# Patient Record
Sex: Male | Born: 1970 | Race: White | Hispanic: No | Marital: Married | Smoking: Never smoker
Health system: Southern US, Community
[De-identification: ages and names within clinical notes are randomized; demographics above are authoritative.]

## PROBLEM LIST (undated history)

## (undated) DIAGNOSIS — Z8719 Personal history of other diseases of the digestive system: Secondary | ICD-10-CM

## (undated) DIAGNOSIS — K649 Unspecified hemorrhoids: Secondary | ICD-10-CM

## (undated) DIAGNOSIS — C801 Malignant (primary) neoplasm, unspecified: Secondary | ICD-10-CM

## (undated) HISTORY — PX: CYST REMOVAL HAND: SHX6279

## (undated) HISTORY — PX: WISDOM TOOTH EXTRACTION: SHX21

## (undated) HISTORY — DX: Malignant (primary) neoplasm, unspecified: C80.1

## (undated) HISTORY — DX: Personal history of other diseases of the digestive system: Z87.19

## (undated) HISTORY — DX: Unspecified hemorrhoids: K64.9

---

## 1998-11-20 ENCOUNTER — Ambulatory Visit (HOSPITAL_BASED_OUTPATIENT_CLINIC_OR_DEPARTMENT_OTHER): Admission: RE | Admit: 1998-11-20 | Discharge: 1998-11-20 | Payer: Self-pay | Admitting: Orthopedic Surgery

## 2001-05-02 DIAGNOSIS — C801 Malignant (primary) neoplasm, unspecified: Secondary | ICD-10-CM

## 2001-05-02 HISTORY — DX: Malignant (primary) neoplasm, unspecified: C80.1

## 2003-04-07 ENCOUNTER — Encounter: Admission: RE | Admit: 2003-04-07 | Discharge: 2003-04-07 | Payer: Self-pay | Admitting: Dermatology

## 2005-01-12 ENCOUNTER — Ambulatory Visit (HOSPITAL_COMMUNITY): Admission: RE | Admit: 2005-01-12 | Discharge: 2005-01-12 | Payer: Self-pay | Admitting: Dermatology

## 2005-10-20 ENCOUNTER — Ambulatory Visit (HOSPITAL_COMMUNITY): Admission: RE | Admit: 2005-10-20 | Discharge: 2005-10-20 | Payer: Self-pay | Admitting: Pulmonary Disease

## 2006-02-03 ENCOUNTER — Ambulatory Visit (HOSPITAL_COMMUNITY): Admission: RE | Admit: 2006-02-03 | Discharge: 2006-02-03 | Payer: Self-pay | Admitting: Dermatology

## 2010-05-22 ENCOUNTER — Encounter: Payer: Self-pay | Admitting: Dermatology

## 2011-11-25 ENCOUNTER — Other Ambulatory Visit: Payer: Self-pay | Admitting: *Deleted

## 2011-11-25 DIAGNOSIS — N5089 Other specified disorders of the male genital organs: Secondary | ICD-10-CM

## 2011-11-28 ENCOUNTER — Ambulatory Visit
Admission: RE | Admit: 2011-11-28 | Discharge: 2011-11-28 | Disposition: A | Discharge status: Home / Self Care | Payer: BC Managed Care – PPO | Source: Ambulatory Visit | Attending: *Deleted | Admitting: *Deleted

## 2011-11-28 DIAGNOSIS — N5089 Other specified disorders of the male genital organs: Secondary | ICD-10-CM

## 2014-05-12 ENCOUNTER — Ambulatory Visit: Payer: BC Managed Care – PPO | Admitting: Internal Medicine

## 2016-02-02 ENCOUNTER — Ambulatory Visit (INDEPENDENT_AMBULATORY_CARE_PROVIDER_SITE_OTHER): Payer: BC Managed Care – PPO | Admitting: Urology

## 2016-02-02 DIAGNOSIS — E291 Testicular hypofunction: Secondary | ICD-10-CM

## 2016-12-14 ENCOUNTER — Encounter: Payer: Self-pay | Admitting: Gastroenterology

## 2017-01-19 ENCOUNTER — Encounter: Payer: Self-pay | Admitting: Gastroenterology

## 2017-01-19 ENCOUNTER — Encounter (INDEPENDENT_AMBULATORY_CARE_PROVIDER_SITE_OTHER): Payer: Self-pay

## 2017-01-19 ENCOUNTER — Ambulatory Visit (INDEPENDENT_AMBULATORY_CARE_PROVIDER_SITE_OTHER): Payer: BC Managed Care – PPO | Admitting: Gastroenterology

## 2017-01-19 VITALS — BP 110/84 | HR 58 | Ht 70.0 in | Wt 177.0 lb

## 2017-01-19 DIAGNOSIS — K625 Hemorrhage of anus and rectum: Secondary | ICD-10-CM | POA: Diagnosis not present

## 2017-01-19 MED ORDER — NA SULFATE-K SULFATE-MG SULF 17.5-3.13-1.6 GM/177ML PO SOLN: 1.0000 | Freq: Once | ORAL | 0 refills | Status: AC

## 2017-01-19 NOTE — Patient Instructions (Signed)
If you are age 47 or older, your body mass index should be between 23-30. Your Body mass index is 25.4 kg/m. If this is out of the aforementioned range listed, please consider follow up with your Primary Care Provider.  If you are age 76 or younger, your body mass index should be between 19-25. Your Body mass index is 25.4 kg/m. If this is out of the aformentioned range listed, please consider follow up with your Primary Care Provider.   You have been scheduled for a colonoscopy. Please follow written instructions given to you at your visit today.  Please pick up your prep supplies at the pharmacy within the next 1-3 days. If you use inhalers (even only as needed), please bring them with you on the day of your procedure. Your physician has requested that you go to www.startemmi.com and enter the access code given to you at your visit today. This web site gives a general overview about your procedure. However, you should still follow specific instructions given to you by our office regarding your preparation for the procedure.  Thank you for choosing [facility]  Dr Wilfrid Lund III

## 2017-01-19 NOTE — Progress Notes (Signed)
[facility] Consult Note:  History: Terrence Bruce 01/19/2017  Referring physician: Celene Squibb, MD  Reason for consult/chief complaint: Hemorrhoids (intermittent rectal bleeding )   Subjective  HPI:  This is a 46 year old [employer] self-referred after recommendation from a coworker for evaluation of his rectal bleeding. He reports a couple of years of intermittent rectal bleeding with occasional itching and burning. He denies dyschezia or chronic abdominal pain. His appetite is good and his weight stable. He denies any upper digestive symptoms. He has had no improvement from suppository therapy. His bowel habits are regular, he does not strain, but admits he sometimes sits on the toilet too long while reading.   ROS:  Review of Systems  Constitutional: Negative for appetite change and unexpected weight change.  HENT: Negative for mouth sores and voice change.   Eyes: Negative for pain and redness.  Respiratory: Negative for cough and shortness of breath.   Cardiovascular: Negative for chest pain and palpitations.  Genitourinary: Negative for dysuria and hematuria.  Musculoskeletal: Negative for arthralgias and myalgias.  Skin: Negative for pallor and rash.  Neurological: Negative for weakness and headaches.  Hematological: Negative for adenopathy.     Past Medical History: Past Medical History:  Diagnosis Date  . Hemorrhoids      Past Surgical History: Past Surgical History:  Procedure Laterality Date  . CYST REMOVAL HAND     finger  . WISDOM TOOTH EXTRACTION       Family History: Family History  Problem Relation Age of Onset  . Heart disease Mother        bypass  . Liver disease Father   . Prostate cancer Maternal Grandfather        onet in his 49s    Social History: Social History   Social History  . Marital status: Married    Spouse name: N/A  . Number of children: N/A  . 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 Smoking status: Never Smoker  . Smokeless tobacco: Never Used  . Alcohol use Yes     Comment: occ  . Drug use: No  . Sexual activity: Yes   Other Topics Concern  . None   Social History Narrative  . None    Allergies: No Known Allergies  Outpatient Meds: Current Outpatient Prescriptions  Medication Sig Dispense Refill  . Na Sulfate-K Sulfate-Mg Sulf 17.5-3.13-1.6 GM/180ML SOLN Take 1 kit by mouth once. 354 mL 0   No current facility-administered medications for this visit.       ___________________________________________________________________ Objective   Exam:  BP 110/84   Pulse (!) 58   Ht '5\' 10"'  (1.778 m)   Wt 177 lb (80.3 kg)   BMI 25.40 kg/m    General: this is a(n) Well-appearing middle-aged man   Eyes: sclera anicteric, no redness  ENT: oral mucosa moist without lesions, no cervical or supraclavicular lymphadenopathy, good dentition  CV: RRR without murmur, S1/S2, no JVD, no peripheral edema  Resp: clear to auscultation bilaterally, normal RR and effort noted  GI: soft, no tenderness, with active bowel sounds. No guarding or palpable organomegaly noted.  Skin; warm and dry, no rash or jaundice noted  Neuro: awake, alert and oriented x 3. Normal gross motor function and fluent speech Rectal: Normal external, normal sphincter tone, no fissure or tenderness. No palpable internal lesions. Soft stool in the rectal vault. Prostate soft,  no nodules felt   Assessment: Encounter Diagnosis  Name Primary?  . Rectal bleeding Yes    Probable symptomatic internal hemorrhoids. Less likely neoplasia as cause for bleeding.  Plan:  Colonoscopy to identify source of bleeding. He is agreeable after discussion of procedure and risks.  The benefits and risks of the planned procedure were described in detail with the patient or (when appropriate) their health care proxy.  Risks were outlined as  including, but not limited to, bleeding, infection, perforation, adverse medication reaction leading to cardiac or pulmonary decompensation, or pancreatitis (if ERCP).  The limitation of incomplete mucosal visualization was also discussed.  No guarantees or warranties were given.  Appointment was made for probable banding shortly after the colonoscopy. Hemorrhoidal anatomy was described to the patient using a diagram, as well as an initial discussion of rationale for and technique of banding. It typically requires 3 sessions separated by 2 or 3 weeks to band the usual 3 columns of hemorrhoids.  Thank you for the courtesy of this consult.  Please call me with any questions or concerns.  Nelida Meuse III  CC: Celene Squibb, MD

## 2017-02-22 ENCOUNTER — Encounter: Payer: Self-pay | Admitting: Gastroenterology

## 2017-03-08 ENCOUNTER — Encounter: Payer: BC Managed Care – PPO | Admitting: Gastroenterology

## 2017-03-08 ENCOUNTER — Telehealth: Payer: Self-pay | Admitting: Gastroenterology

## 2017-03-08 NOTE — Telephone Encounter (Signed)
Yes, please call patient back to reschedule his hemorrhoid banding to after colonoscopy. Thank you.

## 2017-03-14 ENCOUNTER — Encounter: Payer: BC Managed Care – PPO | Admitting: Gastroenterology

## 2017-04-05 ENCOUNTER — Encounter: Payer: BC Managed Care – PPO | Admitting: Gastroenterology

## 2017-04-14 ENCOUNTER — Encounter: Payer: BC Managed Care – PPO | Admitting: Gastroenterology

## 2017-05-23 ENCOUNTER — Encounter: Payer: Self-pay | Admitting: Gastroenterology

## 2017-06-23 ENCOUNTER — Ambulatory Visit (AMBULATORY_SURGERY_CENTER): Payer: Self-pay

## 2017-06-23 ENCOUNTER — Encounter (INDEPENDENT_AMBULATORY_CARE_PROVIDER_SITE_OTHER): Payer: Self-pay

## 2017-06-23 ENCOUNTER — Telehealth: Payer: Self-pay

## 2017-06-23 VITALS — Ht 70.0 in | Wt 187.4 lb

## 2017-06-23 DIAGNOSIS — K625 Hemorrhage of anus and rectum: Secondary | ICD-10-CM

## 2017-06-23 DIAGNOSIS — Z800 Family history of malignant neoplasm of digestive organs: Secondary | ICD-10-CM

## 2017-06-23 MED ORDER — NA SULFATE-K SULFATE-MG SULF 17.5-3.13-1.6 GM/177ML PO SOLN: 1.0000 | Freq: Once | ORAL | 0 refills | Status: AC

## 2017-06-23 NOTE — Telephone Encounter (Signed)
SENT REFERRAL TO SCHEDULING, NOTES ON FILE FROM DR London Pepper [phone]

## 2017-06-23 NOTE — Progress Notes (Signed)
Per pt, no allergies to soy or egg products.Pt not taking any weight loss meds or using  O2 at home.  Pt refused emmi video. 

## 2017-06-26 ENCOUNTER — Encounter: Payer: Self-pay | Admitting: Gastroenterology

## 2017-07-06 ENCOUNTER — Encounter: Payer: Self-pay | Admitting: Gastroenterology

## 2017-07-06 ENCOUNTER — Ambulatory Visit (AMBULATORY_SURGERY_CENTER): Payer: BC Managed Care – PPO | Admitting: Gastroenterology

## 2017-07-06 VITALS — BP 114/69 | HR 51 | Temp 98.0°F | Resp 11 | Ht 70.0 in | Wt 187.0 lb

## 2017-07-06 DIAGNOSIS — K921 Melena: Secondary | ICD-10-CM | POA: Diagnosis present

## 2017-07-06 DIAGNOSIS — D125 Benign neoplasm of sigmoid colon: Secondary | ICD-10-CM

## 2017-07-06 DIAGNOSIS — D127 Benign neoplasm of rectosigmoid junction: Secondary | ICD-10-CM

## 2017-07-06 DIAGNOSIS — K625 Hemorrhage of anus and rectum: Secondary | ICD-10-CM

## 2017-07-06 MED ORDER — SODIUM CHLORIDE 0.9 % IV SOLN: 500.0000 mL | Freq: Once | INTRAVENOUS | Status: AC

## 2017-07-06 NOTE — Progress Notes (Signed)
Called to room to assist during endoscopic procedure.  Patient ID and intended procedure confirmed with present staff. Received instructions for my participation in the procedure from the performing physician.  

## 2017-07-06 NOTE — Progress Notes (Signed)
Pt's states no medical or surgical changes since previsit or office visit. 

## 2017-07-06 NOTE — Op Note (Signed)
[facility] Patient Name: Alexandria Current Procedure Date: 07/06/2017 9:20 AM MRN: [id number] Endoscopist: Marmet. Loletha Carrow , MD Age: 47 Referring MD:  Date of Birth: 25-Aug-1970 Gender: Male Account #: [id number] Procedure:                Colonoscopy Indications:              Rectal bleeding Medicines:                Monitored Anesthesia Care Procedure:                Pre-Anesthesia Assessment:                           - Prior to the procedure, a History and Physical                            was performed, and patient medications and                            allergies were reviewed. The patient's tolerance of                            previous anesthesia was also reviewed. The risks                            and benefits of the procedure and the sedation                            options and risks were discussed with the patient.                            All questions were answered, and informed consent                            was obtained. Prior Anticoagulants: The patient has                            taken no previous anticoagulant or antiplatelet                            agents. ASA Grade Assessment: II - A patient with                            mild systemic disease. After reviewing the risks                            and benefits, the patient was deemed in                            satisfactory condition to undergo the procedure.                           After obtaining informed consent, the colonoscope  was passed under direct vision. Throughout the                            procedure, the patient's blood pressure, pulse, and                            oxygen saturations were monitored continuously. The                            Colonoscope was introduced through the anus and                            advanced to the the cecum, identified by                            appendiceal orifice and ileocecal valve. The                    colonoscopy was performed without difficulty. The                            patient tolerated the procedure well. The quality                            of the bowel preparation was excellent. The                            ileocecal valve, appendiceal orifice, and rectum                            were photographed. Scope In: 9:30:47 AM Scope Out: 9:44:29 AM Scope Withdrawal Time: 0 hours 11 minutes 3 seconds  Total Procedure Duration: 0 hours 13 minutes 42 seconds  Findings:                 The perianal and digital rectal examinations were                            normal.                           A 5 mm polyp was found in the recto-sigmoid colon.                            The polyp was sessile. The polyp was removed with a                            cold snare. Resection and retrieval were complete.                           The exam was otherwise without abnormality on                            direct and retroflexion views. Complications:            No immediate complications. Estimated Blood Loss:  Estimated blood loss was minimal. Impression:               - One 5 mm polyp at the recto-sigmoid colon,                            removed with a cold snare. Resected and retrieved.                           - The examination was otherwise normal on direct                            and retroflexion views.                           Benign anal bleeding - no hemorrhoids seen. Recommendation:           - Patient has a contact number available for                            emergencies. The signs and symptoms of potential                            delayed complications were discussed with the                            patient. Return to normal activities tomorrow.                            Written discharge instructions were provided to the                            patient.                           - Resume previous diet.                           - Continue  present medications.                           - Await pathology results.                           - Repeat colonoscopy is recommended for                            surveillance. The colonoscopy date will be                            determined after pathology results from today's                            exam become available for review.                           - Stool softener as needed.  Avoid prolonged toilet time.                           Return to my office as needed. Mavryk Pino L. Loletha Carrow, MD 07/06/2017 9:57:35 AM This report has been signed electronically.

## 2017-07-06 NOTE — Patient Instructions (Signed)
Handout given on polyps  YOU HAD AN ENDOSCOPIC PROCEDURE TODAY AT THE [facility]:   Refer to the procedure report that was given to you for any specific questions about what was found during the examination.  If the procedure report does not answer your questions, please call your gastroenterologist to clarify.  If you requested that your care partner not be given the details of your procedure findings, then the procedure report has been included in a sealed envelope for you to review at your convenience later.  YOU SHOULD EXPECT: Some feelings of bloating in the abdomen. Passage of more gas than usual.  Walking can help get rid of the air that was put into your GI tract during the procedure and reduce the bloating. If you had a lower endoscopy (such as a colonoscopy or flexible sigmoidoscopy) you may notice spotting of blood in your stool or on the toilet paper. If you underwent a bowel prep for your procedure, you may not have a normal bowel movement for a few days.  Please Note:  You might notice some irritation and congestion in your nose or some drainage.  This is from the oxygen used during your procedure.  There is no need for concern and it should clear up in a day or so.  SYMPTOMS TO REPORT IMMEDIATELY:   Following lower endoscopy (colonoscopy or flexible sigmoidoscopy):  Excessive amounts of blood in the stool  Significant tenderness or worsening of abdominal pains  Swelling of the abdomen that is new, acute  Fever of 100F or higher    For urgent or emergent issues, a gastroenterologist can be reached at any hour by calling [phone].   DIET:  We do recommend a small meal at first, but then you may proceed to your regular diet.  Drink plenty of fluids but you should avoid alcoholic beverages for 24 hours.  ACTIVITY:  You should plan to take it easy for the rest of today and you should NOT DRIVE or use heavy machinery until tomorrow (because of the sedation  medicines used during the test).    FOLLOW UP: Our staff will call the number listed on your records the next business day following your procedure to check on you and address any questions or concerns that you may have regarding the information given to you following your procedure. If we do not reach you, we will leave a message.  However, if you are feeling well and you are not experiencing any problems, there is no need to return our call.  We will assume that you have returned to your regular daily activities without incident.  If any biopsies were taken you will be contacted by phone or by letter within the next 1-3 weeks.  Please call us at [phone] if you have not heard about the biopsies in 3 weeks.    SIGNATURES/CONFIDENTIALITY: You and/or your care partner have signed paperwork which will be entered into your electronic medical record.  These signatures attest to the fact that that the information above on your After Visit Summary has been reviewed and is understood.  Full responsibility of the confidentiality of this discharge information lies with you and/or your care-partner. 

## 2017-07-06 NOTE — Progress Notes (Signed)
Report to PACU, RN, vss, BBS= Clear.  

## 2017-07-07 ENCOUNTER — Telehealth: Payer: Self-pay | Admitting: *Deleted

## 2017-07-07 NOTE — Telephone Encounter (Signed)
  Follow up Call-  Call back number 07/06/2017  Post procedure Call Back phone  # [phone]  Permission to leave phone message Yes  Some recent data might be hidden     Patient questions:  Do you have a fever, pain , or abdominal swelling? No. Pain Score  0 *  Have you tolerated food without any problems? Yes.    Have you been able to return to your normal activities? Yes.    Do you have any questions about your discharge instructions: Diet   No. Medications  No. Follow up visit  No.  Do you have questions or concerns about your Care? No.  Actions: * If pain score is 4 or above: No action needed, pain <4.

## 2017-07-11 ENCOUNTER — Encounter: Payer: Self-pay | Admitting: Gastroenterology

## 2017-07-12 ENCOUNTER — Encounter: Payer: Self-pay | Admitting: *Deleted

## 2017-07-13 ENCOUNTER — Other Ambulatory Visit: Payer: Self-pay

## 2017-07-13 ENCOUNTER — Encounter: Payer: Self-pay | Admitting: Cardiovascular Disease

## 2017-07-13 ENCOUNTER — Ambulatory Visit: Payer: BC Managed Care – PPO | Admitting: Cardiovascular Disease

## 2017-07-13 VITALS — BP 118/74 | HR 51 | Ht 70.0 in | Wt 182.0 lb

## 2017-07-13 DIAGNOSIS — R079 Chest pain, unspecified: Secondary | ICD-10-CM | POA: Diagnosis not present

## 2017-07-13 DIAGNOSIS — E785 Hyperlipidemia, unspecified: Secondary | ICD-10-CM | POA: Diagnosis not present

## 2017-07-13 NOTE — Patient Instructions (Signed)
Medication Instructions:  Continue all current medications.  Labwork: none  Testing/Procedures: none  Follow-Up: As needed.    Any Other Special Instructions Will Be Listed Below (If Applicable).  If you need a refill on your cardiac medications before your next appointment, please call your pharmacy.  

## 2017-07-13 NOTE — Progress Notes (Signed)
CARDIOLOGY CONSULT NOTE  Patient ID: Terrence Bruce MRN: [id number] DOB/AGE: 01/23/1971 47 y.o.  Admit date: (Not on file) Primary Physician: London Pepper, MD Referring Physician: Dr. Orland Mustard  Reason for Consultation: Chest pain  HPI: Terrence Bruce is a 47 y.o. male who is being seen today for the evaluation of chest pain at the request of London Pepper, MD.   ECG performed today which I reviewed demonstrates sinus bradycardia, 52 bpm, with no ischemic ST segment or T wave abnormalities.  I reviewed notes from his PCP including labs.  Recent lipids demonstrate total cholesterol 231, triglycerides 182, HDL 45, LDL 149.  He is working on lifestyle modification to control lipids.  HbA1c was 5.4%.  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  He said he had been under more stress recently as he was trying to finish his degree which he did in December 2018.  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  This also led to more stress and he began experiencing episodic nonexertional retrosternal chest discomfort.    His wife is a PA.  She recommended he try Zantac.  Since that time symptoms have significantly resolved.  He now exercises regularly and runs on a treadmill without any exertional chest pain or dyspnea.  4 weeks ago he underwent a state trooper evaluation where he had to wear a vest and jump 10 feet across a ditch, crawl under a fence, run, 230 step ups, and move a 200 pound barrel.  The time limit was 6 minutes and he completed the exercise and 3 minutes and 17 seconds.   Family history: Mother underwent four-vessel CABG at the age of 52.  She has hypertension and hypercholesterolemia.  She is a non-smoker.  She grew up eating fried foods.   No Known Allergies  Current Outpatient Medications  Medication Sig Dispense Refill  . Omega-3 Fatty Acids (FISH OIL PO) Take by mouth.    .  ranitidine (ZANTAC) 150 MG tablet Take 150 mg by mouth 2 (two) times daily.     Current Facility-Administered Medications  Medication Dose Route Frequency Provider Last Rate Last Dose  . 0.9 %  sodium chloride infusion  500 mL Intravenous Once Doran Stabler, MD        Past Medical History:  Diagnosis Date  . Cancer ([place]) 2003   melanoma on back  . Hemorrhoids   . History of rectal bleeding     Past Surgical History:  Procedure Laterality Date  . CYST REMOVAL HAND     finger/right trigger   . WISDOM TOOTH EXTRACTION      Social History   Socioeconomic History  . Marital status: Married    Spouse name: Not on file  . Number of children: Not on file  . Years of education: Not on file  . Highest education level: Not on file  Social Needs  . Financial resource strain: Not on file  . Food insecurity - worry: Not on file  . Food insecurity - inability: Not on file  . Transportation needs - medical: Not on file  . Transportation needs - non-medical: Not on file  Occupational History  . 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 Smoking status: Never Smoker  . Smokeless tobacco: Never Used  Substance and Sexual Activity  . Alcohol use: Yes    Comment: occ  .  Drug use: No  . Sexual activity: Yes  Other Topics Concern  . Not on file  Social History Narrative  . Not on file      Current Meds  Medication Sig  . Omega-3 Fatty Acids (FISH OIL PO) Take by mouth.  . ranitidine (ZANTAC) 150 MG tablet Take 150 mg by mouth 2 (two) times daily.   Current Facility-Administered Medications for the 07/13/17 encounter (Office Visit) with Herminio Commons, MD  Medication  . 0.9 %  sodium chloride infusion      Review of systems complete and found to be negative unless listed above in HPI    Physical exam Blood pressure 118/74, pulse (!) 51, height 5\' 10"  (1.778 m), weight 182 lb (82.6 kg), SpO2 99 %. General: NAD Neck: No JVD, no thyromegaly or thyroid nodule.   Lungs: Clear to auscultation bilaterally with normal respiratory effort. CV: Nondisplaced PMI.  Bradycardic, regular rhythm, normal S1/S2, no S3/S4, no murmur.  No peripheral edema.  No carotid bruit.    Abdomen: Soft, nontender, no distention.  Skin: Intact without lesions or rashes.  Neurologic: Alert and oriented x 3.  Psych: Normal affect. Extremities: No clubbing or cyanosis.  HEENT: Normal.   ECG: Most recent ECG reviewed.   Labs: No results found for: K, BUN, CREATININE, ALT, TSH, HGB   Lipids: No results found for: LDLCALC, LDLDIRECT, CHOL, TRIG, HDL      ASSESSMENT AND PLAN:  1.  Chest pain: This is atypical for a cardiac etiology and appears to have been related to GERD, symptoms are nonexertional and have been mostly alleviated with Zantac and reduced stress.  Given his overall profile, his 10-year risk of ASCVD is 3.1% which is in the low risk category.  We discussed this in detail.  2.  Mixed dyslipidemia: He does not require statin therapy at this time.  We discussed lifestyle modification in the form of dietary changes and increased exercise which he is trying to do.     Disposition: Follow up as needed  Signed: Kate Sable, M.D., F.A.C.C.  07/13/2017, 8:55 AM

## 2019-06-11 ENCOUNTER — Encounter: Payer: Self-pay | Admitting: Internal Medicine

## 2019-06-11 ENCOUNTER — Other Ambulatory Visit: Payer: Self-pay

## 2019-06-11 ENCOUNTER — Ambulatory Visit: Payer: BC Managed Care – PPO | Admitting: Internal Medicine

## 2019-06-11 VITALS — BP 144/87 | HR 56 | Ht 70.0 in | Wt 186.8 lb

## 2019-06-11 DIAGNOSIS — R079 Chest pain, unspecified: Secondary | ICD-10-CM | POA: Diagnosis not present

## 2019-06-11 DIAGNOSIS — E785 Hyperlipidemia, unspecified: Secondary | ICD-10-CM | POA: Diagnosis not present

## 2019-06-11 MED ORDER — ROSUVASTATIN CALCIUM 20 MG PO TABS: 20.0000 mg | ORAL_TABLET | Freq: Every day | ORAL | 3 refills | Status: DC

## 2019-06-11 NOTE — Patient Instructions (Addendum)
Medication Instructions:    increase rosuvastatin to 20 mg daily  ( may complete your 10 mg bottle of Rosuvastatin by taking 2 tablets to equal 20 mg total)    *If you need a refill on your cardiac medications before your next appointment, please call your pharmacy*  Lab Work: NOT NEEDED  Testing/Procedures:  CAN BE [place]   Your physician has requested that you have cardiac CT. Cardiac computed tomography (CT) is a painless test that uses an x-ray machine to take clear, detailed pictures of your heart. For further information please visit [facility]ugeFiesta[facility]tn. Please follow instruction sheet as given.    Follow-Up: At [facility], you and your health needs are our priority.  As part of our continuing mission to provide you with exceptional heart care, we have created designated Provider Care Teams.  These Care Teams include your primary Cardiologist (physician) and Advanced Practice Providers (APPs -  Physician Assistants and Nurse Practitioners) who all work together to provide you with the care you need, when you need it.  Your next appointment:   2 month(s)  The format for your next appointment:   In Person  Provider:   Cherlynn Kaiser, MD  Other Instructions N/a     Your cardiac CT will be scheduled at one of the below locations:   [facility] [address] [place], [place] [address] [phone]  [facility] [address] [place], [place] [address] [phone]  If scheduled at [facility], please arrive at the [facility] main entrance of [facility] 30 minutes prior to test start time. Proceed to the [facility] Radiology Department (first floor) to check-in and test prep.  If scheduled at [facility], please arrive 15 mins early for check-in and test prep.  Please follow these instructions carefully  (unless otherwise directed):  Please  Have lab BMP one week  Prior to test   On the Night Before the Test: . Be sure to Drink plenty of water. . Do not consume any caffeinated/decaffeinated beverages or chocolate 12 hours prior to your test. . Do not take any antihistamines 12 hours prior to your test.   On the Day of the Test: . Drink plenty of water. Do not drink any water within one hour of the test. . Do not eat any food 4 hours prior to the test. . You may take your regular medications prior to the test.         After the Test: . Drink plenty of water. . After receiving IV contrast, you may experience a mild flushed feeling. This is normal. . On occasion, you may experience a mild rash up to 24 hours after the test. This is not dangerous. If this occurs, you can take Benadryl 25 mg and increase your fluid intake. . If you experience trouble breathing, this can be serious. If it is severe call 911 IMMEDIATELY. If it is mild, please call our office.   Once we have confirmed authorization from your insurance company, we will call you to set up a date and time for your test.   For non-scheduling related questions, please contact the cardiac imaging nurse navigator should you have any questions/concerns: Marchia Bond, RN Navigator Cardiac Imaging [facility] [phone] mobile
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
## 2019-07-18 ENCOUNTER — Ambulatory Visit: Admission: RE | Admit: 2019-07-18 | Payer: BC Managed Care – PPO | Source: Ambulatory Visit

## 2019-07-31 ENCOUNTER — Telehealth (HOSPITAL_COMMUNITY): Payer: Self-pay | Admitting: Emergency Medicine

## 2019-07-31 NOTE — Telephone Encounter (Signed)
Reaching out to patient to offer assistance regarding upcoming cardiac imaging study; pt verbalizes understanding of appt date/time, parking situation and where to check in, pre-test NPO status and medications ordered, and verified current allergies; name and call back number provided for further questions should they arise Marquist Binstock RN Navigator Cardiac Imaging [facility] [place] [facility] [phone] office [phone] cell 

## 2019-08-01 ENCOUNTER — Ambulatory Visit
Admission: RE | Admit: 2019-08-01 | Discharge: 2019-08-01 | Disposition: A | Discharge status: Home / Self Care | Payer: BC Managed Care – PPO | Source: Ambulatory Visit | Attending: Internal Medicine | Admitting: Internal Medicine

## 2019-08-01 ENCOUNTER — Other Ambulatory Visit: Payer: Self-pay

## 2019-08-01 DIAGNOSIS — R079 Chest pain, unspecified: Secondary | ICD-10-CM

## 2019-08-01 MED ORDER — IOHEXOL 350 MG/ML SOLN
100.0000 mL | Freq: Once | INTRAVENOUS | Status: AC | PRN
  Administered 2019-08-01: 100 mL via INTRAVENOUS

## 2019-08-01 MED ORDER — NITROGLYCERIN 0.4 MG SL SUBL
0.8000 mg | SUBLINGUAL_TABLET | Freq: Once | SUBLINGUAL | Status: AC
  Administered 2019-08-01: 0.8 mg via SUBLINGUAL

## 2019-08-01 MED ORDER — METOPROLOL TARTRATE 5 MG/5ML IV SOLN
5.0000 mg | Freq: Once | INTRAVENOUS | Status: AC
  Administered 2019-08-01: 5 mg via INTRAVENOUS

## 2019-08-01 NOTE — Progress Notes (Signed)
Patient tolerated CT well. Ambulated to exit steady gait.

## 2019-08-12 ENCOUNTER — Ambulatory Visit: Payer: BC Managed Care – PPO | Admitting: Internal Medicine

## 2019-08-22 ENCOUNTER — Ambulatory Visit: Payer: BC Managed Care – PPO | Admitting: Internal Medicine

## 2019-08-28 ENCOUNTER — Ambulatory Visit: Payer: BC Managed Care – PPO | Admitting: Internal Medicine

## 2019-08-28 ENCOUNTER — Other Ambulatory Visit: Payer: Self-pay

## 2019-08-28 ENCOUNTER — Encounter: Payer: Self-pay | Admitting: Internal Medicine

## 2019-08-28 VITALS — BP 148/84 | HR 62 | Ht 70.0 in | Wt 196.2 lb

## 2019-08-28 DIAGNOSIS — E785 Hyperlipidemia, unspecified: Secondary | ICD-10-CM

## 2019-08-28 DIAGNOSIS — R030 Elevated blood-pressure reading, without diagnosis of hypertension: Secondary | ICD-10-CM | POA: Diagnosis not present

## 2019-08-28 DIAGNOSIS — R079 Chest pain, unspecified: Secondary | ICD-10-CM | POA: Diagnosis not present

## 2019-08-28 NOTE — Patient Instructions (Signed)
Medication Instructions:  Your physician recommends that you continue on your current medications as directed. Please refer to the Current Medication list given to you today.  *If you need a refill on your cardiac medications before your next appointment, please call your pharmacy*  Lab Work: None ordered today  Testing/Procedures: None ordered today  Follow-Up: At [facility], you and your health needs are our priority.  As part of our continuing mission to provide you with exceptional heart care, we have created designated Provider Care Teams.  These Care Teams include your primary Cardiologist (physician) and Advanced Practice Providers (APPs -  Physician Assistants and Nurse Practitioners) who all work together to provide you with the care you need, when you need it.  We recommend signing up for the patient portal called "MyChart".  Sign up information is provided on this After Visit Summary.  MyChart is used to connect with patients for Virtual Visits (Telemedicine).  Patients are able to view lab/test results, encounter notes, upcoming appointments, etc.  Non-urgent messages can be sent to your provider as well.   To learn more about what you can do with MyChart, go to [email or web address].    Your next appointment:   6 month(s)  The format for your next appointment:   In Person  Provider:   You may see Elouise Munroe, MD or one of the following Advanced Practice Providers on your designated Care Team:    Rosaria Ferries, PA-C  Jory Sims, DNP, ANP  Cadence Kathlen Mody, NP

## 2019-08-28 NOTE — Progress Notes (Signed)
Cardiology Office Note:    Date:  08/28/2019   ID:  Terrence Bruce, DOB [birth year], MRN [id number]  PCP:  London Pepper, MD  Cardiologist:  Elouise Munroe, MD  Electrophysiologist:  None   Referring MD: London Pepper, MD   Chief Complaint: follow up chest pain, CCTA results  History of Present Illness:    Terrence Bruce is a 49 y.o. male with no significant past cardiovascular history who presents for follow-up of CCTA and chest pain.  We reviewed the results of his coronary CTA which was normal.  He continues to have mild chest pain but feels famotidine helps and takes this twice a day at the direction of his wife who is a PA.  Continues to take Crestor without abnormal symptoms.    Mildly elevated BP.   Past Medical History:  Diagnosis Date  . Cancer ([place]) 2003   melanoma on back  . Hemorrhoids   . History of rectal bleeding     Past Surgical History:  Procedure Laterality Date  . CYST REMOVAL HAND     finger/right trigger   . WISDOM TOOTH EXTRACTION      Current Medications: Current Meds  Medication Sig  . FLUoxetine (PROZAC) 20 MG capsule Take 10 mg by mouth daily.  . Omega-3 Fatty Acids (FISH OIL PO) Take by mouth.  . ranitidine (ZANTAC) 150 MG tablet Take 150 mg by mouth 2 (two) times daily.  . rosuvastatin (CRESTOR) 20 MG tablet Take 1 tablet (20 mg total) by mouth daily.   Current Facility-Administered Medications for the 08/28/19 encounter (Office Visit) with Elouise Munroe, MD  Medication  . 0.9 %  sodium chloride infusion     Allergies:   Patient has no known allergies.   Social History   Socioeconomic History  . Marital status: Married    Spouse name: Not on file  . Number of children: Not on file  . Years of education: Not on file  . Highest education level: Not on file  Occupational History  . 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 Smoking status: Never Smoker  . Smokeless tobacco: Never Used  Substance and Sexual  Activity  . Alcohol use: Yes    Comment: occ  . Drug use: No  . Sexual activity: Yes  Other Topics Concern  . Not on file  Social History Narrative  . Not on file   Social Determinants of Health   Financial Resource Strain:   . Difficulty of Paying Living Expenses:   Food Insecurity:   . Worried About Charity fundraiser in the Last Year:   . Arboriculturist in the Last Year:   Transportation Needs:   . Film/video editor (Medical):   Marland Kitchen Lack of Transportation (Non-Medical):   Physical Activity:   . Days of Exercise per Week:   . Minutes of Exercise per Session:   Stress:   . Feeling of Stress :   Social Connections:   . Frequency of Communication with Friends and Family:   . Frequency of Social Gatherings with Friends and Family:   . Attends Religious Services:   . Active Member of Clubs or Organizations:   . Attends Archivist Meetings:   Marland Kitchen Marital Status:      Family History: The patient's family history includes Colon cancer in his maternal grandfather and maternal uncle; Heart disease in his mother; Liver disease in his father; Prostate cancer in his maternal grandfather.  ROS:   Please see the history of present illness.    All other systems reviewed and are negative.  EKGs/Labs/Other Studies Reviewed:    The following studies were reviewed today:  EKG:  n/a  Recent Labs: No results found for requested labs within last 8760 hours.  Recent Lipid Panel No results found for: CHOL, TRIG, HDL, CHOLHDL, VLDL, LDLCALC, LDLDIRECT  Physical Exam:    VS:  BP (!) 148/84   Pulse 62   Ht 5\' 10"  (1.778 m)   Wt 196 lb 3.2 oz (89 kg)   SpO2 97%   BMI 28.15 kg/m     Wt Readings from Last 5 Encounters:  08/28/19 196 lb 3.2 oz (89 kg)  06/11/19 186 lb 12.8 oz (84.7 kg)  07/13/17 182 lb (82.6 kg)  07/06/17 187 lb (84.8 kg)  06/23/17 187 lb 6.4 oz (85 kg)     Constitutional: No acute distress Eyes: sclera non-icteric, normal conjunctiva and  lids ENMT: mask in place Cardiovascular: regular rhythm, normal rate, no murmurs. S1 and S2 normal. Radial pulses normal bilaterally. No jugular venous distention.  Respiratory: clear to auscultation bilaterally GI : normal bowel sounds, soft and nontender. No distention.   MSK: extremities warm, well perfused. No edema.  NEURO: grossly nonfocal exam, moves all extremities. PSYCH: alert and oriented x 3, normal mood and affect.   ASSESSMENT:    1. Chest pain, unspecified type   2. Hyperlipidemia LDL goal <70   3. Elevated BP without diagnosis of hypertension    PLAN:    Chest pain, unspecified type - occasional episodes that respond to famotidine. Continue famotidine. Observe for anginal quality and consider long acting or SL nitro for chest pain if not resolved with H2 blocker.   Hyperlipidemia LDL goal <70 - continue crestor.   Elevated BP without diagnosis of hypertension -  Mildly elevated BP, will try lifestyle factors such as exercise increase and will recheck in 6 mo. Can address with PCP as well. Would probably start amlodipine for antianginal effect as well BP management.  Total time of encounter: 30 minutes total time of encounter, including 16 minutes spent in face-to-face patient care on the date of this encounter. This time includes coordination of care and counseling regarding above mentioned problem list. Remainder of non-face-to-face time involved reviewing chart documents/testing relevant to the patient encounter and documentation in the medical record. I have independently reviewed documentation from referring provider.   Cherlynn Kaiser, MD [place]  [facility]    Medication Adjustments/Labs and Tests Ordered: Current medicines are reviewed at length with the patient today.  Concerns regarding medicines are outlined above.  No orders of the defined types were placed in this encounter.  No orders of the defined types were placed in this  encounter.   Patient Instructions  Medication Instructions:  Your physician recommends that you continue on your current medications as directed. Please refer to the Current Medication list given to you today.  *If you need a refill on your cardiac medications before your next appointment, please call your pharmacy*  Lab Work: None ordered today  Testing/Procedures: None ordered today  Follow-Up: At [facility], you and your health needs are our priority.  As part of our continuing mission to provide you with exceptional heart care, we have created designated Provider Care Teams.  These Care Teams include your primary Cardiologist (physician) and Advanced Practice Providers (APPs -  Physician Assistants and Nurse Practitioners) who all work together to provide you  with the care you need, when you need it.  We recommend signing up for the patient portal called "MyChart".  Sign up information is provided on this After Visit Summary.  MyChart is used to connect with patients for Virtual Visits (Telemedicine).  Patients are able to view lab/test results, encounter notes, upcoming appointments, etc.  Non-urgent messages can be sent to your provider as well.   To learn more about what you can do with MyChart, go to [email or web address].    Your next appointment:   6 month(s)  The format for your next appointment:   In Person  Provider:   You may see Elouise Munroe, MD or one of the following Advanced Practice Providers on your designated Care Team:    Rosaria Ferries, PA-C  Jory Sims, DNP, ANP  Cadence Kathlen Mody, NP

## 2019-12-31 ENCOUNTER — Telehealth: Payer: Self-pay | Admitting: Internal Medicine

## 2019-12-31 DIAGNOSIS — E785 Hyperlipidemia, unspecified: Secondary | ICD-10-CM

## 2019-12-31 NOTE — Telephone Encounter (Signed)
New message:     Patient calling stating that the doctor had up some medications and the patient knees are bothering him. Please call patient back.

## 2019-12-31 NOTE — Telephone Encounter (Signed)
Agree with recommendations.  

## 2019-12-31 NOTE — Telephone Encounter (Signed)
Returned call to patient, he reports knee/joint pain with the increase of crestor (increased from 10 to 20 -06/2019).    He states he increased his fish oil but he would like to try an alternative if possible.    He tolerated 10 mg crestor okay without pain.  Advised to go ahead and decrease back to 10 mg and will route to MD and pharmD to review/advise.    Coronary CTA-calcium score 0 LDL 04/2019 125

## 2019-12-31 NOTE — Telephone Encounter (Signed)
STOP taking crestor for 5 days, then resume at 10mg  daily and schedule appointment with [facility] (pharmacist) to discuss alternative therapy.

## 2020-01-03 MED ORDER — ROSUVASTATIN CALCIUM 10 MG PO TABS: 10.0000 mg | ORAL_TABLET | Freq: Every day | ORAL | 3 refills | Status: DC

## 2020-01-03 NOTE — Telephone Encounter (Signed)
Left message with recommendations (ok per DPR)-advised would have scheduler call to arrange appt.

## 2020-02-10 ENCOUNTER — Ambulatory Visit: Payer: BC Managed Care – PPO

## 2020-02-10 NOTE — Progress Notes (Deleted)
Patient ID: ASSER LUCENA                 DOB: 10-10-1970                    MRN: [id number]     HPI: Terrence Bruce is a 49 y.o. male patient referred to lipid clinic by Dr Margaretann Loveless. PMH is significant for chest pain, hyperlipidemia, elevated BP  Current Medications:  Rosuvastatin 10mg  daily  Intolerances:  Rosuvastatin 20mg  daily  LDL goal:   Diet:   Exercise:   Family History: The patient's family history includes Colon cancer in his maternal grandfather and maternal uncle; Heart disease in his mother; Liver disease in his father; Prostate cancer in his maternal grandfather.  Social History: denies tobacco use, admits occasional alcohol use  Labs:  Past Medical History:  Diagnosis Date  . Cancer ([place]) 2003   melanoma on back  . Hemorrhoids   . History of rectal bleeding     Current Outpatient Medications on File Prior to Visit  Medication Sig Dispense Refill  . FLUoxetine (PROZAC) 20 MG capsule Take 10 mg by mouth daily.    . Omega-3 Fatty Acids (FISH OIL PO) Take by mouth.    . ranitidine (ZANTAC) 150 MG tablet Take 150 mg by mouth 2 (two) times daily.    . rosuvastatin (CRESTOR) 10 MG tablet Take 1 tablet (10 mg total) by mouth daily. 90 tablet 3   Current Facility-Administered Medications on File Prior to Visit  Medication Dose Route Frequency Provider Last Rate Last Admin  . 0.9 %  sodium chloride infusion  500 mL Intravenous Once Doran Stabler, MD        No Known Allergies  No problem-specific Assessment & Plan notes found for this encounter.    Anmol Paschen Rodriguez-Guzman PharmD, BCPS, [place] [address] 02/10/2020 12:53 PM

## 2020-02-18 ENCOUNTER — Encounter: Payer: Self-pay | Admitting: General Practice

## 2020-02-24 ENCOUNTER — Ambulatory Visit: Payer: BC Managed Care – PPO | Admitting: Internal Medicine

## 2020-03-06 ENCOUNTER — Telehealth: Payer: BC Managed Care – PPO | Admitting: Internal Medicine

## 2020-03-12 ENCOUNTER — Encounter: Payer: Self-pay | Admitting: Internal Medicine

## 2020-03-12 ENCOUNTER — Telehealth (INDEPENDENT_AMBULATORY_CARE_PROVIDER_SITE_OTHER): Payer: BC Managed Care – PPO | Admitting: Internal Medicine

## 2020-03-12 VITALS — Ht 70.0 in | Wt 187.0 lb

## 2020-03-12 DIAGNOSIS — E785 Hyperlipidemia, unspecified: Secondary | ICD-10-CM

## 2020-03-12 NOTE — Patient Instructions (Signed)
Medication Instructions:  No Changes In Medications at this time.   *If you need a refill on your cardiac medications before your next appointment, please call your pharmacy*  Lab Work: FASTING LIPID PANEL IN 6 MONTHS- PLEASE HAVE THIS COMPLETED PRIOR TO NEXT VISIT. WILL SEND LAB SLIP/REMINDER IN THE MAIL  If you have labs (blood work) drawn today and your tests are completely normal, you will receive your results only by: [place] MyChart Message (if you have MyChart) OR . A paper copy in the mail If you have any lab test that is abnormal or we need to change your treatment, we will call you to review the results.  Follow-Up: At [facility], you and your health needs are our priority.  As part of our continuing mission to provide you with exceptional heart care, we have created designated Provider Care Teams.  These Care Teams include your primary Cardiologist (physician) and Advanced Practice Providers (APPs -  Physician Assistants and Nurse Practitioners) who all work together to provide you with the care you need, when you need it.  We recommend signing up for the patient portal called "MyChart".  Sign up information is provided on this After Visit Summary.  MyChart is used to connect with patients for Virtual Visits (Telemedicine).  Patients are able to view lab/test results, encounter notes, upcoming appointments, etc.  Non-urgent messages can be sent to your provider as well.   To learn more about what you can do with MyChart, go to [email or web address].    Your next appointment: 6 months   The format for your next appointment:   VIRTUAL   Provider:   Cherlynn Kaiser, MD

## 2020-03-12 NOTE — Progress Notes (Signed)
Virtual Visit via Telephone Note   This visit type was conducted due to national recommendations for restrictions regarding the COVID-19 Pandemic (e.g. social distancing) in an effort to limit this patient's exposure and mitigate transmission in our community.  Due to his co-morbid illnesses, this patient is at least at moderate risk for complications without adequate follow up.  This format is felt to be most appropriate for this patient at this time.  The patient did not have access to video technology/had technical difficulties with video requiring transitioning to audio format only (telephone).  All issues noted in this document were discussed and addressed.  No physical exam could be performed with this format.  Please refer to the patient's chart for his  consent to telehealth for [facility].    Date:  03/12/2020   ID:  Terrence Bruce, DOB [birth year], MRN [id number] The patient was identified using 2 identifiers.  Patient Location: Home Provider Location: Home Office  PCP:  London Pepper, MD  Cardiologist:  Elouise Munroe, MD  Electrophysiologist:  None   Evaluation Performed:  Follow-Up Visit  Chief Complaint:  Follow up chest pain  History of Present Illness:    Terrence Bruce is a 49 y.o. male with hyperlipidemia and no coronary artery disease on recent CCTA who presents for follow up.   No additional episodes of chest pain.  He was having some knee pain, and started taking half of his crestor (was on 20 mg, now taking half of that). Knee pain improved. We discussed rechecking lipids in 6 mo and possibly reducing dose further if lipids well controlled and knee pain still bothersome.   The patient does not have symptoms concerning for COVID-19 infection (fever, chills, cough, or new shortness of breath).    Past Medical History:  Diagnosis Date  . Cancer ([place]) 2003   melanoma on back  . Hemorrhoids   . History of rectal bleeding    Past Surgical  History:  Procedure Laterality Date  . CYST REMOVAL HAND     finger/right trigger   . WISDOM TOOTH EXTRACTION       Current Meds  Medication Sig  . FLUoxetine (PROZAC) 20 MG capsule Take 10 mg by mouth daily.  . Omega-3 Fatty Acids (FISH OIL PO) Take by mouth.  . ranitidine (ZANTAC) 150 MG tablet Take 150 mg by mouth 2 (two) times daily.  . rosuvastatin (CRESTOR) 10 MG tablet Take 1 tablet (10 mg total) by mouth daily.   Current Facility-Administered Medications for the 03/12/20 encounter (Telemedicine) with Elouise Munroe, MD  Medication  . 0.9 %  sodium chloride infusion     Allergies:   Patient has no known allergies.   Social History   Tobacco Use  . Smoking status: Never Smoker  . Smokeless tobacco: Never Used  Vaping Use  . Vaping Use: Never used  Substance Use Topics  . Alcohol use: Yes    Comment: occ  . Drug use: No     Family Hx: The patient's family history includes Colon cancer in his maternal grandfather and maternal uncle; Heart disease in his mother; Liver disease in his father; Prostate cancer in his maternal grandfather.  ROS:   Please see the history of present illness.     All other systems reviewed and are negative.   Prior CV studies:   The following studies were reviewed today:    Labs/Other Tests and Data Reviewed:    EKG:  No ECG reviewed.  Recent Labs: No results found for requested labs within last 8760 hours.   Recent Lipid Panel No results found for: CHOL, TRIG, HDL, CHOLHDL, LDLCALC, LDLDIRECT  Wt Readings from Last 3 Encounters:  03/12/20 187 lb (84.8 kg)  08/28/19 196 lb 3.2 oz (89 kg)  06/11/19 186 lb 12.8 oz (84.7 kg)     Risk Assessment/Calculations:     Objective:    Vital Signs:  Ht 5\' 10"  (1.778 m)   Wt 187 lb (84.8 kg)   BMI 26.83 kg/m    VITAL SIGNS:  reviewed GEN:  no acute distress RESPIRATORY:  normal respiratory effort, no increased work of breathing NEURO:  alert and oriented x 3, speech  normal PSYCH:  normal affect   ASSESSMENT & PLAN:    Hyperlipidemia LDL goal <70 - Plan: Lipid panel  Reviewed lipid panel and goal to lower LDL to <70 for best prevention. Can continue on half tab of crestor (10 mg) and will recheck lipids in 6 mo.   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 Orland Mustard as well. No concerns currently.   COVID-19 Education: The signs and symptoms of COVID-19 were discussed with the patient and how to seek care for testing (follow up with PCP or arrange E-visit).  The importance of social distancing was discussed today.  Time:   Today, I have spent 15 minutes with the patient with telehealth technology discussing the above problems.    Patient Instructions  Medication Instructions:  No Changes In Medications at this time.   *If you need a refill on your cardiac medications before your next appointment, please call your pharmacy*  Lab Work: FASTING LIPID PANEL IN 6 MONTHS- PLEASE HAVE THIS COMPLETED PRIOR TO NEXT VISIT. WILL SEND LAB SLIP/REMINDER IN THE MAIL  If you have labs (blood work) drawn today and your tests are completely normal, you will receive your results only by: Marland Kitchen MyChart Message (if you have MyChart) OR . A paper copy in the mail If you have any lab test that is abnormal or we need to change your treatment, we will call you to review the results.  Follow-Up: At [facility], you and your health needs are our priority.  As part of our continuing mission to provide you with exceptional heart care, we have created designated Provider Care Teams.  These Care Teams include your primary Cardiologist (physician) and Advanced Practice Providers (APPs -  Physician Assistants and Nurse Practitioners) who all work together to provide you with the care you need, when you need it.  We recommend signing up for the patient portal called "MyChart".  Sign up information is  provided on this After Visit Summary.  MyChart is used to connect with patients for Virtual Visits (Telemedicine).  Patients are able to view lab/test results, encounter notes, upcoming appointments, etc.  Non-urgent messages can be sent to your provider as well.   To learn more about what you can do with MyChart, go to [email or web address].    Your next appointment: 6 months   The format for your next appointment:   VIRTUAL   Provider:   Cherlynn Kaiser, MD  Signed, Elouise Munroe, MD  03/12/2020 8:37 AM    [place]

## 2020-09-25 ENCOUNTER — Other Ambulatory Visit: Payer: Self-pay

## 2020-09-25 DIAGNOSIS — E785 Hyperlipidemia, unspecified: Secondary | ICD-10-CM

## 2020-12-31 LAB — LIPID PANEL
Chol/HDL Ratio: 5.7 ratio — ABNORMAL HIGH (ref 0.0–5.0)
Cholesterol, Total: 280 mg/dL — ABNORMAL HIGH (ref 100–199)
HDL: 49 mg/dL (ref >39)
LDL Chol Calc (NIH): 181 mg/dL — ABNORMAL HIGH (ref 0–99)
Triglycerides: 263 mg/dL — ABNORMAL HIGH (ref 0–149)
VLDL Cholesterol Cal: 50 mg/dL — ABNORMAL HIGH (ref 5–40)

## 2021-01-08 ENCOUNTER — Telehealth (INDEPENDENT_AMBULATORY_CARE_PROVIDER_SITE_OTHER): Payer: Self-pay | Admitting: Internal Medicine

## 2021-01-08 VITALS — Ht 70.0 in | Wt 183.0 lb

## 2021-01-08 DIAGNOSIS — R079 Chest pain, unspecified: Secondary | ICD-10-CM

## 2021-01-08 DIAGNOSIS — Z79899 Other long term (current) drug therapy: Secondary | ICD-10-CM

## 2021-01-08 DIAGNOSIS — E785 Hyperlipidemia, unspecified: Secondary | ICD-10-CM

## 2021-01-08 MED ORDER — ATORVASTATIN CALCIUM 10 MG PO TABS: 10.0000 mg | ORAL_TABLET | Freq: Every day | ORAL | 3 refills | Status: DC

## 2021-01-08 NOTE — Patient Instructions (Signed)
Medication Instructions:  START: LIPITOR (ATORVASTATIN) '10mg'$  DAILY  *If you need a refill on your cardiac medications before your next appointment, please call your pharmacy*  Lab Work: LIPIDS/LIVER FUNCTION TESTS RIGHT BEFORE THANKSGIVING THIS YEAR. NO APPOINTMENT NEEDED, THE LAB IS OPEN MON-FRI FROM 8am to 4pm and CLOSED FOR LUNCH FROM 1pm-2pm DAILY. YOU WILL RECEIVE A REMINDER LETTER AND LAB SLIPS RIGHT BEFORE THEY ARE DUE If you have labs (blood work) drawn today and your tests are completely normal, you will receive your results only by: [place] (if you have MyChart) OR A paper copy in the mail If you have any lab test that is abnormal or we need to change your treatment, we will call you to review the results.  Follow-Up: At [facility], you and your health needs are our priority.  As part of our continuing mission to provide you with exceptional heart care, we have created designated Provider Care Teams.  These Care Teams include your primary Cardiologist (physician) and Advanced Practice Providers (APPs -  Physician Assistants and Nurse Practitioners) who all work together to provide you with the care you need, when you need it.  We recommend signing up for the patient portal called "MyChart".  Sign up information is provided on this After Visit Summary.  MyChart is used to connect with patients for Virtual Visits (Telemedicine).  Patients are able to view lab/test results, encounter notes, upcoming appointments, etc.  Non-urgent messages can be sent to your provider as well.   To learn more about what you can do with MyChart, go to [email or web address].    Your next appointment:   MARCH 28th at [facility]  The format for your next appointment:   Virtual Visit   Provider:   Cherlynn Kaiser, MD

## 2021-01-08 NOTE — Progress Notes (Signed)
Virtual Visit via Video Note   This visit type was conducted due to national recommendations for restrictions regarding the COVID-19 Pandemic (e.g. social distancing) in an effort to limit this patient's exposure and mitigate transmission in our community.  Due to his co-morbid illnesses, this patient is at least at moderate risk for complications without adequate follow up.  This format is felt to be most appropriate for this patient at this time.  All issues noted in this document were discussed and addressed.  A limited physical exam was performed with this format.  Please refer to the patient's chart for his consent to telehealth for [facility].   Date:  01/08/2021   ID:  Terrence Bruce, DOB 01-Oct-1970, MRN [id number] The patient was identified using 2 identifiers.  Patient Location: Home Provider Location: Home Office   PCP:  London Pepper, MD   [facility] Providers Cardiologist:  Elouise Munroe, MD     Evaluation Performed:  Follow-Up Visit  Chief Complaint:  HLD, chest pain  History of Present Illness:    Terrence Bruce is a 50 y.o. male with hyperlipidemia and a family history of coronary artery disease who presents for follow-up of hyperlipidemia and chest pain.  No additional episodes of chest pain since last visit.  Recall coronary CTA showed 0 calcium score and no coronary plaque.  He has been off of cholesterol-lowering therapy for 4 to 5 months due to knee pain which resolved after stopping the medication. Interested in trying atorvastatin after a friend and family member had better luck with this medication. We reviewed suboptimal lipid control and family history of CAD.  The patient does not have symptoms concerning for COVID-19 infection (fever, chills, cough, or new shortness of breath).    Past Medical History:  Diagnosis Date   Cancer ([place]) 2003   melanoma on back   Hemorrhoids    History of rectal bleeding    Past Surgical History:   Procedure Laterality Date   CYST REMOVAL HAND     finger/right trigger    WISDOM TOOTH EXTRACTION       Current Meds  Medication Sig   atorvastatin (LIPITOR) 10 MG tablet Take 1 tablet (10 mg total) by mouth daily.   Current Facility-Administered Medications for the 01/08/21 encounter (Video Visit) with Elouise Munroe, MD  Medication   0.9 %  sodium chloride infusion     Allergies:   Patient has no known allergies.   Social History   Tobacco Use   Smoking status: Never   Smokeless tobacco: Never  Vaping Use   Vaping Use: Never used  Substance Use Topics   Alcohol use: Yes    Comment: occ   Drug use: No     Family Hx: The patient's family history includes Colon cancer in his maternal grandfather and maternal uncle; Heart disease in his mother; Liver disease in his father; Prostate cancer in his maternal grandfather.  ROS:   Please see the history of present illness.     All other systems reviewed and are negative.   Prior CV studies:   The following studies were reviewed today:    Labs/Other Tests and Data Reviewed:    EKG:  No ECG reviewed.  Recent Labs: No results found for requested labs within last 8760 hours.   Recent Lipid Panel Lab Results  Component Value Date/Time   CHOL 280 (H) 12/30/2020 08:06 AM   TRIG 263 (H) 12/30/2020 08:06 AM   HDL 49  12/30/2020 08:06 AM   CHOLHDL 5.7 (H) 12/30/2020 08:06 AM   LDLCALC 181 (H) 12/30/2020 08:06 AM    Wt Readings from Last 3 Encounters:  01/08/21 183 lb (83 kg)  03/12/20 187 lb (84.8 kg)  08/28/19 196 lb 3.2 oz (89 kg)     Risk Assessment/Calculations:          Objective:    Vital Signs:  Ht '5\' 10"'$  (1.778 m)   Wt 183 lb (83 kg)   BMI 26.26 kg/m    VITAL SIGNS:  reviewed GEN:  no acute distress EYES:  sclerae anicteric, EOMI - Extraocular Movements Intact RESPIRATORY:  normal respiratory effort, symmetric expansion CARDIOVASCULAR:  no peripheral edema SKIN:  no rash, lesions or  ulcers. MUSCULOSKELETAL:  no obvious deformities. NEURO:  alert and oriented x 3, no obvious focal deficit PSYCH:  normal affect  ASSESSMENT & PLAN:    1. Hyperlipidemia LDL goal <70   2. Chest pain, unspecified type   3. Medication management    HLD  Medication management - off of crestor due to knee pain which resolved with cessation. He wants to try atorvastatin given rise in lipids, LDL well above goal. Discussed overall low ASCVD risk score however with LDL of 181 and Trig 283, this requires aggressive attention.   Chest pain - no significant recurrence.         COVID-19 Education: The signs and symptoms of COVID-19 were discussed with the patient and how to seek care for testing (follow up with PCP or arrange E-visit).  The importance of social distancing was discussed today.  Time:   Today, I have spent 15 minutes with the patient with telehealth technology discussing the above problems.     Medication Adjustments/Labs and Tests Ordered: Current medicines are reviewed at length with the patient today.  Concerns regarding medicines are outlined above.   Tests Ordered: Orders Placed This Encounter  Procedures   Lipid panel   Hepatic function panel    Medication Changes: Meds ordered this encounter  Medications   atorvastatin (LIPITOR) 10 MG tablet    Sig: Take 1 tablet (10 mg total) by mouth daily.    Dispense:  90 tablet    Refill:  3    Patient Instructions  Medication Instructions:  START: LIPITOR (ATORVASTATIN) '10mg'$  DAILY  *If you need a refill on your cardiac medications before your next appointment, please call your pharmacy*  Lab Work: LIPIDS/LIVER FUNCTION TESTS RIGHT BEFORE THANKSGIVING THIS YEAR. NO APPOINTMENT NEEDED, THE LAB IS OPEN MON-FRI FROM 8am to 4pm and CLOSED FOR LUNCH FROM 1pm-2pm DAILY. YOU WILL RECEIVE A REMINDER LETTER AND LAB SLIPS RIGHT BEFORE THEY ARE DUE If you have labs (blood work) drawn today and your tests are completely  normal, you will receive your results only by: [place] (if you have MyChart) OR A paper copy in the mail If you have any lab test that is abnormal or we need to change your treatment, we will call you to review the results.  Follow-Up: At [facility], you and your health needs are our priority.  As part of our continuing mission to provide you with exceptional heart care, we have created designated Provider Care Teams.  These Care Teams include your primary Cardiologist (physician) and Advanced Practice Providers (APPs -  Physician Assistants and Nurse Practitioners) who all work together to provide you with the care you need, when you need it.  We recommend signing up for the patient portal called "MyChart".  Sign up information is provided on this After Visit Summary.  MyChart is used to connect with patients for Virtual Visits (Telemedicine).  Patients are able to view lab/test results, encounter notes, upcoming appointments, etc.  Non-urgent messages can be sent to your provider as well.   To learn more about what you can do with MyChart, go to [email or web address].    Your next appointment:   MARCH 28th at [facility]  The format for your next appointment:   Virtual Visit   Provider:   Cherlynn Kaiser, MD   Signed, Elouise Munroe, MD  01/08/2021 8:00 AM    [place]

## 2021-03-01 ENCOUNTER — Ambulatory Visit: Payer: Self-pay

## 2021-03-01 NOTE — Progress Notes (Deleted)
03/01/2021 Terrence Bruce 24-Oct-1970 [id number]   HPI:  Terrence Bruce is a 50 y.o. male patient of Dr Margaretann Loveless, who presents today for a lipid clinic evaluation.  See pertinent past medical history below.  Past Medical History:                    Current Medications:  Cholesterol Goals:   Intolerant/previously tried:  Family history:   Diet:   Exercise:    Labs:    Current Outpatient Medications  Medication Sig Dispense Refill   atorvastatin (LIPITOR) 10 MG tablet Take 1 tablet (10 mg total) by mouth daily. 90 tablet 3   FLUoxetine (PROZAC) 20 MG capsule Take 10 mg by mouth daily.     Omega-3 Fatty Acids (FISH OIL PO) Take by mouth.     ranitidine (ZANTAC) 150 MG tablet Take 150 mg by mouth 2 (two) times daily. (Patient not taking: Reported on 01/08/2021)     Current Facility-Administered Medications  Medication Dose Route Frequency Provider Last Rate Last Admin   0.9 %  sodium chloride infusion  500 mL Intravenous Once Doran Stabler, MD        No Known Allergies  Past Medical History:  Diagnosis Date   Cancer ([place]) 2003   melanoma on back   Hemorrhoids    History of rectal bleeding     There were no vitals taken for this visit.   No problem-specific Assessment & Plan notes found for this encounter.   Tommy Medal PharmD CPP [facility] [address] [place], [place] [address] [phone]

## 2021-03-15 ENCOUNTER — Other Ambulatory Visit: Payer: Self-pay

## 2021-03-15 DIAGNOSIS — E785 Hyperlipidemia, unspecified: Secondary | ICD-10-CM

## 2021-03-25 LAB — LIPID PANEL
Chol/HDL Ratio: 3.9 ratio (ref 0.0–5.0)
Cholesterol, Total: 187 mg/dL (ref 100–199)
HDL: 48 mg/dL (ref >39)
LDL Chol Calc (NIH): 110 mg/dL — ABNORMAL HIGH (ref 0–99)
Triglycerides: 167 mg/dL — ABNORMAL HIGH (ref 0–149)
VLDL Cholesterol Cal: 29 mg/dL (ref 5–40)

## 2021-03-25 LAB — HEPATIC FUNCTION PANEL
ALT: 29 IU/L (ref 0–44)
AST: 23 IU/L (ref 0–40)
Albumin: 5.1 g/dL — ABNORMAL HIGH (ref 4.0–5.0)
Alkaline Phosphatase: 70 IU/L (ref 44–121)
Bilirubin Total: 0.9 mg/dL (ref 0.0–1.2)
Bilirubin, Direct: 0.21 mg/dL (ref 0.00–0.40)
Total Protein: 7.5 g/dL (ref 6.0–8.5)

## 2021-03-29 ENCOUNTER — Encounter: Payer: Self-pay | Admitting: Internal Medicine

## 2021-07-04 IMAGING — CT CT HEART MORP W/ CTA COR W/ SCORE W/ CA W/CM &/OR W/O CM
1 of 13 series · 4 of 20 positions shown, 5 images · non-contrast
Comparison: CT abdomen 10/20/2005

Addendum:
CLINICAL DATA: Atypical chestpain

EXAM:
Cardiac/Coronary  CTA
TECHNIQUE: The patient was scanned on a Siemens Somatoform go.Top scanner.

[Series 40: ms multiphase cta coronary 0.60 · axial · 0.42mm/px · z∈[-1464,-1389]mm · 4 of 2826 slices shown, 5 images]
[im 566/2826  vessel]
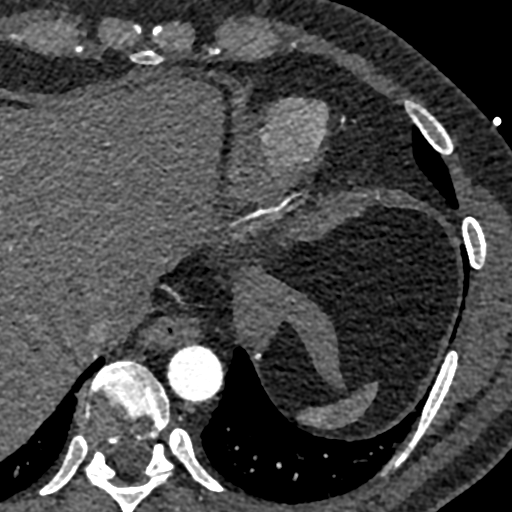
[im 566/2826  lung]
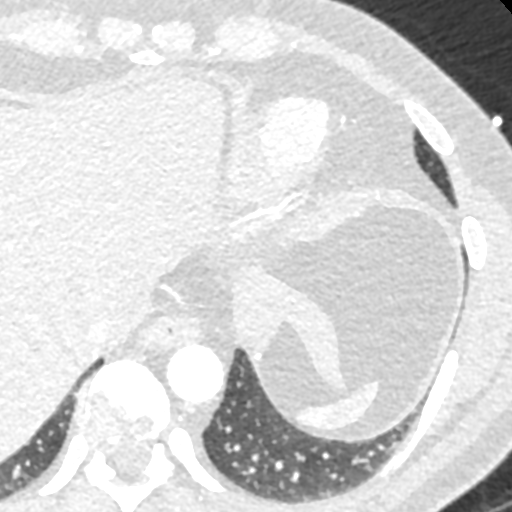
[im 1131/2826  vessel]
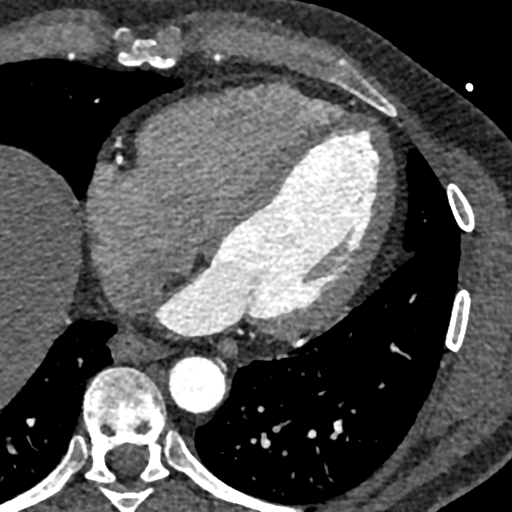
[im 1696/2826  vessel]
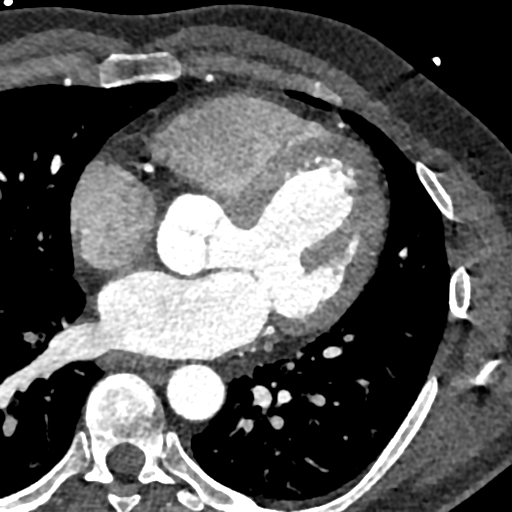
[im 2261/2826  vessel]
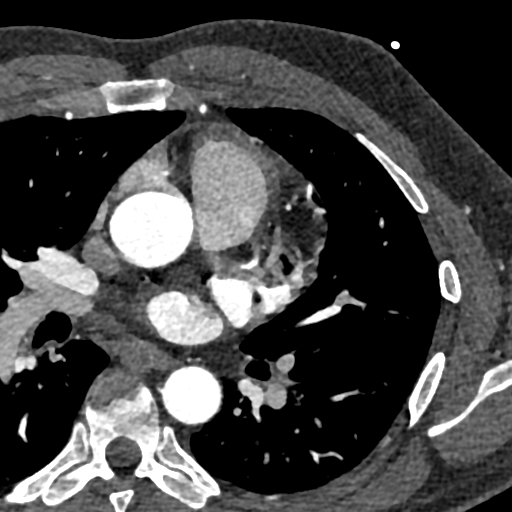

[4 of 20 positions shown; findings below may reference images not displayed]

FINDINGS: A retrospective scan was triggered in the descending thoracic aorta.
Axial non-contrast 3 mm slices were carried out through the heart.
The data set was analyzed on a dedicated work station and scored
using the Agatson method. Gantry rotation speed was 330 msecs and
collimation was .6 mm. 5mg of iv metoprolol and 0.8 mg of sl NTG was
given. The 3D data set was reconstructed in 5% intervals of the
50-95 % of the R-R cycle. Diastolic phases were analyzed on a
dedicated work station using MPR, MIP and VRT modes. The patient
received 85 cc of contrast.

Aorta:  Normal size.  No calcifications.  No dissection.

Aortic Valve:  Trileaflet.  No calcifications.

Coronary Arteries:  Normal coronary origin.  Right dominance.

RCA is a large dominant artery that gives rise to PDA and PLA. There
is no plaque.

Left main is a large artery that gives rise to LAD and LCX arteries.

LAD is a large vessel that has no plaque.

LCX is a non-dominant artery that gives rise to two obtuse marginal
branches. There is no plaque.

Other findings:

Normal pulmonary vein drainage into the left atrium.

Normal left atrial appendage without a thrombus.

Normal size of the pulmonary artery.
IMPRESSION: 1. Coronary calcium score of 0. Patient is at low risk for near term
coronary events

2. Normal coronary origin with right dominance.

3. No evidence of CAD.

4. CAD-RADS 0. No evidence of CAD (0%). Consider non-atherosclerotic
causes of chest pain.

EXAM:
OVER-READ INTERPRETATION  CT CHEST

The following report is an over-read performed by radiologist Dr.
does not include interpretation of cardiac or coronary anatomy or
pathology. The coronary CTA interpretation by the cardiologist is
attached.
FINDINGS: Normal caliber of the visualized thoracic aorta without
atherosclerotic disease. No large filling defects in the main
pulmonary arteries. Visualized mediastinal structures are
unremarkable. Images of upper abdomen are unremarkable. Stable 2 mm
nodule in the lingula on sequence 19, image 35. No significant
airspace disease or lung consolidation in the visualized lungs. No
large pleural effusions. Bone structures are unremarkable.
IMPRESSION: Negative over-read exam.

*** End of Addendum ***
FINDINGS: A retrospective scan was triggered in the descending thoracic aorta.
Axial non-contrast 3 mm slices were carried out through the heart.
The data set was analyzed on a dedicated work station and scored
using the Agatson method. Gantry rotation speed was 330 msecs and
collimation was .6 mm. 5mg of iv metoprolol and 0.8 mg of sl NTG was
given. The 3D data set was reconstructed in 5% intervals of the
50-95 % of the R-R cycle. Diastolic phases were analyzed on a
dedicated work station using MPR, MIP and VRT modes. The patient
received 85 cc of contrast.

Aorta:  Normal size.  No calcifications.  No dissection.

Aortic Valve:  Trileaflet.  No calcifications.

Coronary Arteries:  Normal coronary origin.  Right dominance.

RCA is a large dominant artery that gives rise to PDA and PLA. There
is no plaque.

Left main is a large artery that gives rise to LAD and LCX arteries.

LAD is a large vessel that has no plaque.

LCX is a non-dominant artery that gives rise to two obtuse marginal
branches. There is no plaque.

Other findings:

Normal pulmonary vein drainage into the left atrium.

Normal left atrial appendage without a thrombus.

Normal size of the pulmonary artery.
IMPRESSION: 1. Coronary calcium score of 0. Patient is at low risk for near term
coronary events

2. Normal coronary origin with right dominance.

3. No evidence of CAD.

4. CAD-RADS 0. No evidence of CAD (0%). Consider non-atherosclerotic
causes of chest pain.

## 2021-07-13 ENCOUNTER — Telehealth: Payer: Self-pay

## 2021-07-13 NOTE — Telephone Encounter (Signed)
Lmom for lipid appt need w/pharmd.  ?

## 2021-07-13 NOTE — Progress Notes (Signed)
? ?Virtual Visit via Video Note  ? ?This visit type was conducted due to national recommendations for restrictions regarding the COVID-19 Pandemic (e.g. social distancing) in an effort to limit this patient's exposure and mitigate transmission in our community.  Due to his co-morbid illnesses, this patient is at least at moderate risk for complications without adequate follow up.  This format is felt to be most appropriate for this patient at this time.  All issues noted in this document were discussed and addressed.  A limited physical exam was performed with this format.  Please refer to the patient's chart for his consent to telehealth for [facility]. ? ? ?Date:  07/27/2021  ? ?ID:  Terrence Bruce, DOB 1970/10/24, MRN [id number] ?The patient was identified using 2 identifiers. ? ?Patient Location: Home ?Provider Location: Office/Clinic ? ? ?PCP:  London Pepper, MD ?  ?[facility] Providers ?Cardiologist:  Elouise Munroe, MD    ? ?Evaluation Performed:  Follow-Up Visit ? ?Chief Complaint:  HLD, chest pain ? ?History of Present Illness:   ? ?Terrence Bruce is a 51 y.o. male with hyperlipidemia and a family history of coronary artery disease who presents for follow-up of hyperlipidemia and chest pain. ? ?No recurrent episodes of chest pain. Thinks it's food related ?Recall coronary CTA showed 0 calcium score and no coronary plaque. ? ?He has been tolerating atorvastatin well.  Most recent LDL was 113 which has not significantly changed from November.  Last labs on 07/16/2021.  HDL 59, triglycerides 115.  We discussed increasing the dose of atorvastatin to aggressively lower LDL for primary prevention given a family history of CAD.  He is in agreement with this and would like to double up his atorvastatin to 20 mg daily. ? ?The patient does not have symptoms concerning for COVID-19 infection (fever, chills, cough, or new shortness of breath).  ? ? ?Past Medical History:  ?Diagnosis Date  ? Cancer [facility])  2003  ? 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 Hemorrhoids   ? History of rectal bleeding   ? ?Past Surgical History:  ?Procedure Laterality Date  ? CYST REMOVAL HAND    ? finger/right trigger   ? WISDOM TOOTH EXTRACTION    ?  ? ?Current Meds  ?Medication Sig  ? atorvastatin (LIPITOR) 10 MG tablet Take 1 tablet (10 mg total) by mouth daily.  ? Omega-3 Fatty Acids (FISH OIL PO) Take by mouth.  ? ?Current Facility-Administered Medications for the 07/27/21 encounter (Video Visit) with Elouise Munroe, MD  ?Medication  ? 0.9 %  sodium chloride infusion  ?  ? ?Allergies:   Patient has no known allergies.  ? ?Social History  ? ?Tobacco Use  ? Smoking status: Never  ? Smokeless tobacco: Never  ?Vaping Use  ? Vaping Use: Never used  ?Substance Use Topics  ? Alcohol use: Yes  ?  Comment: occ  ? Drug use: No  ?  ? ?Family Hx: ?The patient's family history includes Colon cancer in his maternal grandfather and maternal uncle; Heart disease in his mother; Liver disease in his father; Prostate cancer in his maternal grandfather. ? ?ROS:   ?Please see the history of present illness.    ? ?All other systems reviewed and are negative. ? ? ?Prior CV studies:   ?The following studies were reviewed today: ? ? ? ?Labs/Other Tests and Data Reviewed:   ? ?EKG:  No ECG reviewed. ? ?Recent Labs: ?03/24/2021: ALT 29  ? ?Recent Lipid Panel ?Lab Results  ?  Component Value Date/Time  ? CHOL 187 03/24/2021 08:26 AM  ? TRIG 167 (H) 03/24/2021 08:26 AM  ? HDL 48 03/24/2021 08:26 AM  ? CHOLHDL 3.9 03/24/2021 08:26 AM  ? LDLCALC 110 (H) 03/24/2021 08:26 AM  ? ? ?Wt Readings from Last 3 Encounters:  ?07/27/21 183 lb (83 kg)  ?01/08/21 183 lb (83 kg)  ?03/12/20 187 lb (84.8 kg)  ?  ? ?Risk Assessment/Calculations:   ?  ? ?    ?Objective:   ? ?Vital Signs:  BP 115/65   Ht '5\' 10"'$  (1.778 m)   Wt 183 lb (83 kg)   BMI 26.26 kg/m?   ? ?VITAL SIGNS:  reviewed ?GEN:  no acute distress ?EYES:  sclerae anicteric, EOMI - Extraocular Movements Intact ?RESPIRATORY:  normal  respiratory effort, symmetric expansion ?CARDIOVASCULAR:  no peripheral edema ?SKIN:  no rash, lesions or ulcers. ?MUSCULOSKELETAL:  no obvious deformities. ?NEURO:  alert and oriented x 3, no obvious focal deficit ?PSYCH:  normal affect ? ?ASSESSMENT & PLAN:   ? ?1. Hyperlipidemia LDL goal <70   ?2. Medication management   ?3. Chest pain, unspecified type   ? ?HLD  ?Medication management ?- off of crestor due to knee pain which resolved with cessation. Tolerating atorvastatin well. LDL improved in Nov 2022. ?-Most recent LDL 07/16/2021 was still 113.  With this in mind we will increase atorvastatin to 20 mg daily.  He can take 2 of his 10 mg tablets and call us in 2 weeks with symptoms.  He may be a good candidate for PCSK9 inhibitor per chart review and prior LDLs being significantly elevated.  Based on his response to atorvastatin 20 mg daily we will explore this option. ?- consider apo B and Lp(a) at next lipid check for risk stratification.  We will recheck labs in 3 months and can obtain these at that time. ?-I have reviewed with pharmacist, consider PCSK9I or leqvio if we are unable to titrate atorvastatin to high intensity dose. ? ?Chest pain - no significant recurrence.   ? ? ?COVID-19 Education: ?The signs and symptoms of COVID-19 were discussed with the patient and how to seek care for testing (follow up with PCP or arrange E-visit).  The importance of social distancing was discussed today. ? ?Time:   ?Today, I have spent 10 minutes with the patient with telehealth technology discussing the above problems.   ? ? ?Medication Adjustments/Labs and Tests Ordered: ?Current medicines are reviewed at length with the patient today.  Concerns regarding medicines are outlined above.  ? ?Tests Ordered: ?No orders of the defined types were placed in this encounter. ? ? ?Medication Changes: ?No orders of the defined types were placed in this encounter. ? ? ?Patient Instructions  ?Medication Instructions:  ?INCREASE  ATORVASTATIN to '20mg'$  ONCE DAILY- PLEASE LET us KNOW HOW YOU ARE DOING IN 2 WEEKS ?*If you need a refill on your cardiac medications before your next appointment, please call your pharmacy* ? ?Follow-Up: ?At [facility], you and your health needs are our priority.  As part of our continuing mission to provide you with exceptional heart care, we have created designated Provider Care Teams.  These Care Teams include your primary Cardiologist (physician) and Advanced Practice Providers (APPs -  Physician Assistants and Nurse Practitioners) who all work together to provide you with the care you need, when you need it. ? ?Your next appointment:   ?6 month(s) VIRTUAL ? ?The format for your next appointment:   ?In Person ? ?  Provider:   ?Elouise Munroe, MD  ? ? ?Signed, ?Elouise Munroe, MD  ?07/27/2021 8:40 AM    ?[place] ? ?

## 2021-07-13 NOTE — Telephone Encounter (Signed)
-----   Message from Elouise Munroe, MD sent at 07/13/2021  3:19 AM EDT ----- ?Did we ever get him scheduled to discuss this? I have an appt with him coming up and can talk to him about seeing you guys if not. ?GA ?----- Message ----- ?From: Rockne Menghini, RPH-CPP ?Sent: 02/08/2021   8:03 AM EDT ?To: Elouise Munroe, MD, Allean Found, CMA ? ?He will qualify.  I found an LDL from 2020 that was > 200.  I'll have Eliga Arvie call him to schedule an appointment.  ? ?Terrence Bruce ?----- Message ----- ?From: Elouise Munroe, MD ?Sent: 02/05/2021   9:09 AM EDT ?To: Rockne Menghini, RPH-CPP ? ?Terrence Bruce, this is the patient I was telling you about. No CAD but elevated lipids off of therapy. Any chance he qualifies for PCKS9I or leqvio? ? ? ? ?

## 2021-07-27 ENCOUNTER — Other Ambulatory Visit: Payer: Self-pay

## 2021-07-27 ENCOUNTER — Telehealth (INDEPENDENT_AMBULATORY_CARE_PROVIDER_SITE_OTHER): Payer: BC Managed Care – PPO | Admitting: Internal Medicine

## 2021-07-27 ENCOUNTER — Encounter: Payer: Self-pay | Admitting: Internal Medicine

## 2021-07-27 VITALS — BP 115/65 | Ht 70.0 in | Wt 183.0 lb

## 2021-07-27 DIAGNOSIS — Z79899 Other long term (current) drug therapy: Secondary | ICD-10-CM

## 2021-07-27 DIAGNOSIS — R079 Chest pain, unspecified: Secondary | ICD-10-CM

## 2021-07-27 DIAGNOSIS — E785 Hyperlipidemia, unspecified: Secondary | ICD-10-CM | POA: Diagnosis not present

## 2021-07-27 NOTE — Patient Instructions (Signed)
Medication Instructions:  ?INCREASE ATORVASTATIN to '20mg'$  ONCE DAILY- PLEASE LET us KNOW HOW YOU ARE DOING IN 2 WEEKS ?*If you need a refill on your cardiac medications before your next appointment, please call your pharmacy* ? ?Follow-Up: ?At [facility], you and your health needs are our priority.  As part of our continuing mission to provide you with exceptional heart care, we have created designated Provider Care Teams.  These Care Teams include your primary Cardiologist (physician) and Advanced Practice Providers (APPs -  Physician Assistants and Nurse Practitioners) who all work together to provide you with the care you need, when you need it. ? ?Your next appointment:   ?6 month(s) VIRTUAL ? ?The format for your next appointment:   ?In Person ? ?Provider:   ?Elouise Munroe, MD  ?

## 2021-08-24 ENCOUNTER — Encounter: Payer: Self-pay | Admitting: Urology

## 2021-08-24 ENCOUNTER — Ambulatory Visit (INDEPENDENT_AMBULATORY_CARE_PROVIDER_SITE_OTHER): Payer: BC Managed Care – PPO | Admitting: Urology

## 2021-08-24 ENCOUNTER — Telehealth: Payer: Self-pay | Admitting: Internal Medicine

## 2021-08-24 VITALS — BP 145/88 | HR 57

## 2021-08-24 DIAGNOSIS — Z79899 Other long term (current) drug therapy: Secondary | ICD-10-CM

## 2021-08-24 DIAGNOSIS — E785 Hyperlipidemia, unspecified: Secondary | ICD-10-CM

## 2021-08-24 DIAGNOSIS — N5201 Erectile dysfunction due to arterial insufficiency: Secondary | ICD-10-CM | POA: Diagnosis not present

## 2021-08-24 DIAGNOSIS — N486 Induration penis plastica: Secondary | ICD-10-CM | POA: Diagnosis not present

## 2021-08-24 DIAGNOSIS — N4889 Other specified disorders of penis: Secondary | ICD-10-CM

## 2021-08-24 MED ORDER — ATORVASTATIN CALCIUM 20 MG PO TABS: 20.0000 mg | ORAL_TABLET | Freq: Every day | ORAL | 3 refills | Status: DC

## 2021-08-24 NOTE — Progress Notes (Signed)
H&P ? ?Chief Complaint: Lump in penis ? 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  He noticed this 2 to 3 months ago.  It is in the mid/distal penis.  He "feels it" in terms of a certain sensation without palpating it, but it is not tender to palpation.  He has had no associated curvature of his penis or narrowing of his penis during erections.  He does have early detumescence at times.  He denies any recent trauma with sexual activity. ? ?Past Medical History:  ?Diagnosis Date  ? Cancer [facility]) 2003  ? 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 Hemorrhoids   ? History of rectal bleeding   ? ? ?Past Surgical History:  ?Procedure Laterality Date  ? CYST REMOVAL HAND    ? finger/right trigger   ? WISDOM TOOTH EXTRACTION    ? ? ?Home Medications:  ?Allergies as of 08/24/2021   ?No Known Allergies ?  ? ?  ?Medication List  ?  ? ?  ? Accurate as of August 24, 2021  9:13 AM. If you have any questions, ask your nurse or doctor.  ?  ?  ? ?  ? ?atorvastatin 10 MG tablet ?Commonly known as: LIPITOR ?Take 1 tablet (10 mg total) by mouth daily. ?  ?FISH OIL PO ?Take by mouth. ?  ?FLUoxetine 20 MG capsule ?Commonly known as: PROZAC ?Take 10 mg by mouth daily. ?  ? ?  ? ? ?Allergies: No Known Allergies ? ?Family History  ?Problem Relation Age of Onset  ? Heart disease Mother   ?     bypass  ? Liver disease Father   ? Prostate cancer Maternal Grandfather   ?     onet in his 74s  ? Colon cancer Maternal Grandfather   ? Colon cancer Maternal Uncle   ? ? ?Social History:  reports that he has never smoked. He has never used smokeless tobacco. He reports current alcohol use. He reports that he does not use drugs. ? ?ROS: ?A complete review of systems was performed.  All systems are negative except for pertinent findings as noted. ? ?Physical Exam:  ?Vital signs in last 24 hours: ?There were no vitals taken for this visit. ?Constitutional:   Alert and oriented, No acute distress ?Cardiovascular: Regular rate  ?Respiratory: Normal respiratory effort ?Genitourinary: Circumcised phallus.  Plaques two thirds of the way down the shaft, bilateral.  No tenderness. ?Neurologic: Grossly intact, no focal deficits ?Psychiatric: Normal mood and affect ? ?I have reviewed prior pt notes ? ?I have reviewed urinalysis results ? ? ?Impression/Assessment:  ?Peyronie's disease, mild at this point without significant change in erections ? ?Mild ED, early detumescence ? ?Plan:  ?I discussed Perrone's disease, possible causative factors, natural history with Terrence Bruce ? ?I will have him come back in about 4 months just for recheck ? ?I offered him PDE 5 inhibitors-he would like to hold for now ? ?

## 2021-08-24 NOTE — Telephone Encounter (Signed)
Patient calling to let us know that he is tolerating atorvastatin. Called patient back and left message letting him know that we got his message about atorvastatin and to call back if there was anything further that he needed to discuss.  ?

## 2021-08-24 NOTE — Telephone Encounter (Signed)
Pt states that he was told to let nurse know how he ws doing with dosage change to the Atorvastatin. Pt states he's doing well and doesn't have any issues. Please adivse ?

## 2021-08-25 LAB — URINALYSIS, ROUTINE W REFLEX MICROSCOPIC
Bilirubin, UA: NEGATIVE
Glucose, UA: NEGATIVE
Ketones, UA: NEGATIVE
Nitrite, UA: POSITIVE — AB
Specific Gravity, UA: 1.015 (ref 1.005–1.030)
Urobilinogen, Ur: 1 mg/dL (ref 0.2–1.0)
pH, UA: 6 (ref 5.0–7.5)

## 2021-08-25 LAB — MICROSCOPIC EXAMINATION
Renal Epithel, UA: NONE SEEN /hpf
WBC, UA: >30 /hpf — AB (ref 0–5)

## 2021-12-28 ENCOUNTER — Encounter: Payer: Self-pay | Admitting: Urology

## 2021-12-28 ENCOUNTER — Ambulatory Visit (INDEPENDENT_AMBULATORY_CARE_PROVIDER_SITE_OTHER): Payer: BC Managed Care – PPO | Admitting: Urology

## 2021-12-28 VITALS — BP 155/85 | HR 69

## 2021-12-28 DIAGNOSIS — N5201 Erectile dysfunction due to arterial insufficiency: Secondary | ICD-10-CM

## 2021-12-28 DIAGNOSIS — N486 Induration penis plastica: Secondary | ICD-10-CM | POA: Diagnosis not present

## 2021-12-28 DIAGNOSIS — N4889 Other specified disorders of penis: Secondary | ICD-10-CM

## 2021-12-28 LAB — URINALYSIS, ROUTINE W REFLEX MICROSCOPIC
Bilirubin, UA: NEGATIVE
Glucose, UA: NEGATIVE
Ketones, UA: NEGATIVE
Leukocytes,UA: NEGATIVE
Nitrite, UA: NEGATIVE
Protein,UA: NEGATIVE
RBC, UA: NEGATIVE
Specific Gravity, UA: 1.01 (ref 1.005–1.030)
Urobilinogen, Ur: 0.2 mg/dL (ref 0.2–1.0)
pH, UA: 6 (ref 5.0–7.5)

## 2021-12-28 NOTE — Progress Notes (Signed)
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  He noticed this 2 to 3 months ago.  It is in the mid/distal penis.  He "feels it" in terms of a certain sensation without palpating it, but it is not tender to palpation.  He has had no associated curvature of his penis or narrowing of his penis during erections.  He does have early detumescence at times.  He denies any recent trauma with sexual activity.  8.29.2023: Here for routine check.  Thankfully, several weeks ago the pain/discomfort with his mid penile plaque has gone away.  His erections are straight, adequate for penetration.  Mild narrowing/hourglass deformity of his penis with erections.  He is satisfied enough with his current situation.  Past Medical History:  Diagnosis Date   Cancer ([facility]) 2003   melanoma on back   Hemorrhoids    History of rectal bleeding     Past Surgical History:  Procedure Laterality Date   CYST REMOVAL HAND     finger/right trigger    WISDOM TOOTH EXTRACTION      Home Medications:  Allergies as of 12/28/2021   No Known Allergies      Medication List        Accurate as of December 28, 2021  8:37 AM. If you have any questions, ask your nurse or doctor.          atorvastatin 20 MG tablet Commonly known as: LIPITOR Take 1 tablet (20 mg total) by mouth daily.   FISH OIL PO Take by mouth.   FLUoxetine 20 MG capsule Commonly known as: PROZAC Take 10 mg by mouth daily.        Allergies: No Known Allergies  Family History  Problem Relation Age of Onset   Heart disease Mother        bypass   Liver disease Father    Prostate cancer Maternal Grandfather        onet in his 36s   Colon cancer Maternal Grandfather    Colon cancer Maternal Uncle     Social History:  reports that he has never smoked. He has never used smokeless tobacco. He reports current  alcohol use. He reports that he does not use drugs.  ROS: A complete review of systems was performed.  All systems are negative except for pertinent findings as noted.  Physical Exam:  Vital signs in last 24 hours: There were no vitals taken for this visit. Constitutional:  Alert and oriented, No acute distress Cardiovascular: Regular rate  Respiratory: Normal respiratory effort Neurologic: Grossly intact, no focal deficits Psychiatric: Normal mood and affect  I have reviewed prior pt notes   Impression/Assessment:  Peyronie's disease, stable, not progressing, perhaps burned-out  Plan:  At this point, no further therapy needed.  I let him know that he may experience some curvature in the future.  If his erections changed significantly, he will get back with me.  Otherwise, he will return as needed

## 2022-05-27 ENCOUNTER — Other Ambulatory Visit: Payer: Self-pay | Admitting: Internal Medicine

## 2022-06-22 ENCOUNTER — Encounter: Payer: Self-pay | Admitting: Gastroenterology

## 2022-09-16 ENCOUNTER — Other Ambulatory Visit: Payer: Self-pay | Admitting: Internal Medicine

## 2022-09-21 ENCOUNTER — Telehealth: Payer: Self-pay | Admitting: Internal Medicine

## 2022-09-21 ENCOUNTER — Other Ambulatory Visit: Payer: Self-pay | Admitting: Internal Medicine

## 2022-09-21 ENCOUNTER — Other Ambulatory Visit: Payer: Self-pay

## 2022-09-21 MED ORDER — ATORVASTATIN CALCIUM 20 MG PO TABS: 20.0000 mg | ORAL_TABLET | Freq: Every day | ORAL | 0 refills | Status: DC

## 2022-09-21 NOTE — Telephone Encounter (Signed)
*  STAT* If patient is at the pharmacy, call can be transferred to refill team.   1. Which medications need to be refilled? (please list name of each medication and dose if known)  atorvastatin (LIPITOR) 20 MG tablet  2. Which pharmacy/location (including street and city if local pharmacy) is medication to be sent to? [phone] - [place], [place] - [address] AT [facility] [address]   3. Do they need a 30 day or 90 day supply?  30 day supply  Patient is completely out of medication.

## 2022-09-27 NOTE — Progress Notes (Unsigned)
Cardiology Office Note:    Date:  09/28/2022   ID:  Terrence Bruce, DOB 1970/08/29, MRN [id number]  PCP:  Farris Has, MD [facility] Cardiologist: Parke Poisson, MD   Reason for visit: Annual follow-up, med refills  History of Present Illness:    Terrence Bruce is a 52 y.o. male with a hx of hyperlipidemia, family history of CAD (mother had CABG in her 2s).  Previously seen for chest pain.  Coronary CTA showed calcium score of 0 and no plaque.  Patient is a never smoker, no diabetes.  He was last seen by Dr. Jacques Navy March 2023.  With LDL 113, Lipitor was increased to 20 mg daily.  Today, he states he is doing well without complaints.  Patient has been out of Lipitor for 1-2 weeks.  He is tolerating the Lipitor without leg pain.  He is scheduled for blood work in 2 weeks.  He has not had a recheck since increasing his lipid Torr from 10 mg to 20 mg daily.  For activity he is lifting weights and trying to increase his aerobic exercise.  He walks with his wife and his dog. He denies chest pain, shortness of breath, syncope, orthopnea, PND or significant pedal edema.     Past Medical History:  Diagnosis Date   Cancer ([facility]) 2003   melanoma on back   Hemorrhoids    History of rectal bleeding     Past Surgical History:  Procedure Laterality Date   CYST REMOVAL HAND     finger/right trigger    WISDOM TOOTH EXTRACTION      Current Medications: Current Meds  Medication Sig   atorvastatin (LIPITOR) 20 MG tablet Take 1 tablet (20 mg total) by mouth daily.   atorvastatin (LIPITOR) 20 MG tablet Take 1 tablet (20 mg total) by mouth daily.   Current Facility-Administered Medications for the 09/28/22 encounter (Office Visit) with Cannon Kettle, PA-C  Medication   0.9 %  sodium chloride infusion     Allergies:   Patient has no known allergies.   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   Tobacco Use   Smoking status: Never   Smokeless tobacco: Never  Vaping Use   Vaping Use: Never used  Substance and Sexual Activity   Alcohol use: Yes    Comment: occ   Drug use: No   Sexual activity: Yes  Other Topics Concern   Not on file  Social History Narrative   Not on file   Social Determinants of Health   Financial Resource Strain: Not on file  Food Insecurity: Not on file  Transportation Needs: Not on file  Physical Activity: Not on file  Stress: Not on file  Social Connections: Not on file     Family History: The patient's family history includes Colon cancer in his maternal grandfather and maternal uncle; Heart disease in his mother; Liver disease in his father; Prostate cancer in his maternal grandfather.  ROS:   Please see the history of present illness.     EKGs/Labs/Other Studies Reviewed:    EKG:  The ekg ordered today demonstrates sinus bradycardia with heart rate 54.  Recent Labs: No results found for requested labs within last  365 days.   Recent Lipid Panel Lab Results  Component Value Date/Time   CHOL 187 03/24/2021 08:26 AM   TRIG 167 (H) 03/24/2021 08:26 AM   HDL 48 03/24/2021 08:26 AM   LDLCALC 110 (H) 03/24/2021 08:26 AM    Physical Exam:    VS:  BP 128/84   Pulse (!) 54   Ht 5\' 10"  (1.778 m)   Wt 193 lb 3.2 oz (87.6 kg)   SpO2 96%   BMI 27.72 kg/m    No data found.   Wt Readings from Last 3 Encounters:  09/28/22 193 lb 3.2 oz (87.6 kg)  07/27/21 183 lb (83 kg)  01/08/21 183 lb (83 kg)     GEN: Well nourished, well developed in no acute distress HEENT: Normal NECK: No JVD; No carotid bruits CARDIAC: RRR, no murmurs, rubs, gallops RESPIRATORY:  Clear to auscultation without rales, wheezing or rhonchi  ABDOMEN: Soft, non-tender, non-distended MUSCULOSKELETAL: No edema SKIN:  Warm and dry NEUROLOGIC:  Alert and oriented PSYCHIATRIC:  Normal affect     ASSESSMENT AND PLAN   Hyperlipidemia with goal LDL less than 70  -Goal less than 70 per Dr. Jacques Navy. -History of knee pain with Crestor -Lipitor dose increased to March 2023.  Refilled today. -Check fasting lipids, apo B and lp(a) with next blood work. -Discussed cholesterol lowering diets - Mediterranean diet, DASH diet, vegetarian diet, low-carbohydrate diet and avoidance of trans fats.  Discussed healthier choice substitutes.  Nuts, high-fiber foods, and fiber supplements may also improve lipids.   Disposition - Follow-up in 1 year.  Medication Adjustments/Labs and Tests Ordered: Current medicines are reviewed at length with the patient today.  Concerns regarding medicines are outlined above.  Orders Placed This Encounter  Procedures   Apolipoprotein B   Lipid panel   Lipoprotein A (LPA)   EKG 12-Lead   Meds ordered this encounter  Medications   atorvastatin (LIPITOR) 20 MG tablet    Sig: Take 1 tablet (20 mg total) by mouth daily.    Dispense:  90 tablet    Refill:  3     Signed, Bernette Mayers  09/28/2022 9:40 AM    [facility]

## 2022-09-28 ENCOUNTER — Ambulatory Visit: Payer: BC Managed Care – PPO | Attending: Physician Assistant | Admitting: Physician Assistant

## 2022-09-28 ENCOUNTER — Encounter: Payer: Self-pay | Admitting: Physician Assistant

## 2022-09-28 VITALS — BP 128/84 | HR 54 | Ht 70.0 in | Wt 193.2 lb

## 2022-09-28 DIAGNOSIS — E785 Hyperlipidemia, unspecified: Secondary | ICD-10-CM | POA: Diagnosis not present

## 2022-09-28 MED ORDER — ATORVASTATIN CALCIUM 20 MG PO TABS: 20.0000 mg | ORAL_TABLET | Freq: Every day | ORAL | 3 refills | Status: DC

## 2022-09-28 NOTE — Patient Instructions (Signed)
Medication Instructions:  No Changes *If you need a refill on your cardiac medications before your next appointment, please call your pharmacy*   Lab Work: Lipid Panel, Lipoprotein A, Apolipoprotein B, : to Be Done In 2 weeks. If you have labs (blood work) drawn today and your tests are completely normal, you will receive your results only by: MyChart Message (if you have MyChart) OR A paper copy in the mail If you have any lab test that is abnormal or we need to change your treatment, we will call you to review the results.   Testing/Procedures: No Testing   Follow-Up: At [facility], you and your health needs are our priority.  As part of our continuing mission to provide you with exceptional heart care, we have created designated Provider Care Teams.  These Care Teams include your primary Cardiologist (physician) and Advanced Practice Providers (APPs -  Physician Assistants and Nurse Practitioners) who all work together to provide you with the care you need, when you need it.  We recommend signing up for the patient portal called "MyChart".  Sign up information is provided on this After Visit Summary.  MyChart is used to connect with patients for Virtual Visits (Telemedicine).  Patients are able to view lab/test results, encounter notes, upcoming appointments, etc.  Non-urgent messages can be sent to your provider as well.   To learn more about what you can do with MyChart, go to [email or web address].    Your next appointment:   1 year(s)  Provider:   Parke Poisson, MD

## 2022-10-01 ENCOUNTER — Other Ambulatory Visit: Payer: Self-pay | Admitting: Internal Medicine

## 2022-10-11 LAB — LIPID PANEL
Chol/HDL Ratio: 3.5 ratio (ref 0.0–5.0)
Cholesterol, Total: 188 mg/dL (ref 100–199)
HDL: 54 mg/dL (ref >39)
LDL Chol Calc (NIH): 115 mg/dL — ABNORMAL HIGH (ref 0–99)
Triglycerides: 103 mg/dL (ref 0–149)
VLDL Cholesterol Cal: 19 mg/dL (ref 5–40)

## 2022-10-11 LAB — LIPOPROTEIN A (LPA): Lipoprotein (a): 12.2 nmol/L (ref <75.0)

## 2022-10-11 LAB — APOLIPOPROTEIN B: Apolipoprotein B: 99 mg/dL — ABNORMAL HIGH (ref <90)

## 2022-10-12 ENCOUNTER — Telehealth: Payer: Self-pay

## 2022-10-12 DIAGNOSIS — E785 Hyperlipidemia, unspecified: Secondary | ICD-10-CM

## 2022-10-12 MED ORDER — ATORVASTATIN CALCIUM 40 MG PO TABS: 40.0000 mg | ORAL_TABLET | Freq: Every day | ORAL | 3 refills | Status: DC

## 2022-10-12 NOTE — Telephone Encounter (Addendum)
Called patient regarding results. Patient had understanding of results. Patient advised to increase Lipitor to 40 mg daily. Patient advised to repeat Lipid panel in 3 month.----- Message from Cannon Kettle, PA-C sent at 10/12/2022 12:36 PM EDT ----- Goal LDL (bad cholesterol) less than 70  -History of knee pain with Crestor -Lipitor dose increased to March 2023.    LDL 115 today.  Elevated apolipoprotein B. -Recommend increasing Lipitor to 40 mg daily, #90 tablets, 3 refills (our office will call in).  You can take Lipitor 20 mg, 2 tablets until current bottle runs out.  Recheck fasting lipids in 3 months. -Recommend- Mediterranean diet, DASH diet, vegetarian diet, low-carbohydrate diet and avoidance of trans fats.  Discussed healthier choice substitutes.  Nuts, high-fiber foods, and fiber supplements may also improve lipids.

## 2023-03-01 ENCOUNTER — Encounter: Payer: Self-pay | Admitting: Internal Medicine

## 2023-03-01 DIAGNOSIS — E785 Hyperlipidemia, unspecified: Secondary | ICD-10-CM

## 2023-03-07 LAB — LIPID PANEL
Chol/HDL Ratio: 3.3 ratio (ref 0.0–5.0)
Cholesterol, Total: 207 mg/dL — ABNORMAL HIGH (ref 100–199)
HDL: 62 mg/dL (ref >39)
LDL Chol Calc (NIH): 114 mg/dL — ABNORMAL HIGH (ref 0–99)
Triglycerides: 177 mg/dL — ABNORMAL HIGH (ref 0–149)
VLDL Cholesterol Cal: 31 mg/dL (ref 5–40)

## 2023-03-07 LAB — HEPATIC FUNCTION PANEL
ALT: 27 [IU]/L (ref 0–44)
AST: 23 [IU]/L (ref 0–40)
Albumin: 4.5 g/dL (ref 3.8–4.9)
Alkaline Phosphatase: 71 [IU]/L (ref 44–121)
Bilirubin Total: 0.9 mg/dL (ref 0.0–1.2)
Bilirubin, Direct: 0.26 mg/dL (ref 0.00–0.40)
Total Protein: 7 g/dL (ref 6.0–8.5)

## 2023-03-10 ENCOUNTER — Encounter: Payer: Self-pay | Admitting: Internal Medicine

## 2023-03-15 NOTE — Telephone Encounter (Signed)
Multiple encounters open. Closing this encounter. Please additional mychart messages for more information.

## 2023-06-09 NOTE — Telephone Encounter (Signed)
 LVM to CB regarding scheduling appt; also asking how he has been feeling re: aches (restarted Lipitor). MC msg also sent. 06/09/23

## 2023-09-03 ENCOUNTER — Encounter: Payer: Self-pay | Admitting: Internal Medicine

## 2023-09-13 ENCOUNTER — Telehealth: Payer: Self-pay | Admitting: Internal Medicine

## 2023-09-13 NOTE — Telephone Encounter (Signed)
 Left voice message for pt regarding labs (per DPR). Pt was notified that he does not need labs prior to his appointment. Pt was told to call if he has any questions.

## 2023-09-13 NOTE — Telephone Encounter (Signed)
 Pt is requesting a callback regarding him having lab orders placed before his appt in July. Please advise

## 2023-09-19 ENCOUNTER — Encounter: Payer: Self-pay | Admitting: Internal Medicine

## 2023-09-19 ENCOUNTER — Ambulatory Visit: Payer: Self-pay | Admitting: Internal Medicine

## 2023-09-19 DIAGNOSIS — E785 Hyperlipidemia, unspecified: Secondary | ICD-10-CM

## 2023-09-21 ENCOUNTER — Other Ambulatory Visit: Payer: Self-pay | Admitting: *Deleted

## 2023-09-21 DIAGNOSIS — E785 Hyperlipidemia, unspecified: Secondary | ICD-10-CM

## 2023-10-05 ENCOUNTER — Ambulatory Visit: Payer: Self-pay | Admitting: Internal Medicine

## 2023-10-05 LAB — HEPATIC FUNCTION PANEL
ALT: 32 IU/L (ref 0–44)
AST: 30 IU/L (ref 0–40)
Albumin: 4.8 g/dL (ref 3.8–4.9)
Alkaline Phosphatase: 69 IU/L (ref 44–121)
Bilirubin Total: 1 mg/dL (ref 0.0–1.2)
Bilirubin, Direct: 0.32 mg/dL (ref 0.00–0.40)
Total Protein: 7.4 g/dL (ref 6.0–8.5)

## 2023-10-05 LAB — LIPID PANEL
Chol/HDL Ratio: 3.1 ratio (ref 0.0–5.0)
Cholesterol, Total: 165 mg/dL (ref 100–199)
HDL: 54 mg/dL (ref >39)
LDL Chol Calc (NIH): 90 mg/dL (ref 0–99)
Triglycerides: 119 mg/dL (ref 0–149)
VLDL Cholesterol Cal: 21 mg/dL (ref 5–40)

## 2023-10-12 ENCOUNTER — Ambulatory Visit: Payer: Self-pay | Admitting: Student

## 2023-10-31 NOTE — Progress Notes (Deleted)
   Cardiology Office Note:    Date:  10/31/2023   ID:  Terrence Bruce, DOB November 12, 1970, MRN [id number]  PCP:  Kip Righter, MD  Cardiologist:  Soyla DELENA Merck, MD { Click to update primary MD,subspecialty MD or APP then REFRESH:1}    Referring MD: Kip Righter, MD   Chief Complaint: follow-up of chest pain and hyperlipidemia  History of Present Illness:    Terrence Bruce is a 53 y.o. male with a history of chest pain with normal coronaries on coronary CTA in 08/2019 and hyperlipidemia as well as a family history of CAD who is followed by Dr. Merck and presents today for routine follow-up.  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 Coronary CTA in 08/2019 showed a coronary calcium  score of 0 with no evidence of CAD.   He was last seen by Delon Holts, PA-C, in 08/2022 at which time he was doing well without any chest pain or cardiac complaints.   Patient presents today for follow-up. ***  History of Chest Pain Coronary CTA in 08/2019 showed a coronary calcium  score of 0 with no evidence of CAD. - No ***  Hyperlipidemia Recent lipid panel in 10/2023: Total Cholesterol 165, Triglycerides 119, HDL 54, LDL 90. LDL acceptable given coronary calcium  score of 0.  - Continue Lipitor ***  EKGs/Labs/Other Studies Reviewed:    The following studies were reviewed:  Coronary CTA 08/01/2019: Impression: 1. Coronary calcium  score of 0. Patient is at low risk for near term coronary events 2. Normal coronary origin with right dominance. 3. No evidence of CAD.  EKG:  EKG ordered today. EKG personally reviewed and demonstrates ***.  Recent Labs: 10/04/2023: ALT 32  Recent Lipid Panel    Component Value Date/Time   CHOL 165 10/04/2023 0821   TRIG 119 10/04/2023 0821   HDL 54 10/04/2023 0821   CHOLHDL 3.1 10/04/2023 0821   LDLCALC 90 10/04/2023 0821    Physical Exam:    Vital Signs: There were no vitals taken for this  visit.    Wt Readings from Last 3 Encounters:  09/28/22 193 lb 3.2 oz (87.6 kg)  07/27/21 183 lb (83 kg)  01/08/21 183 lb (83 kg)     General: 53 y.o. male in no acute distress. HEENT: Normocephalic and atraumatic. Sclera clear.  Neck: Supple. No carotid bruits. No JVD. Heart: *** RRR. Distinct S1 and S2. No murmurs, gallops, or rubs.  Lungs: No increased work of breathing. Clear to ausculation bilaterally. No wheezes, rhonchi, or rales.  Abdomen: Soft, non-distended, and non-tender to palpation.  Extremities: No lower extremity edema.  Radial and distal pedal pulses 2+ and equal bilaterally. Skin: Warm and dry. Neuro: No focal deficits. Psych: Normal affect. Responds appropriately.   Assessment:    No diagnosis found.  Plan:     Disposition: Follow up in ***   Signed, Aline FORBES Door, PA-C  10/31/2023 6:44 AM    [facility]

## 2023-11-06 NOTE — Progress Notes (Unsigned)
  Cardiology Office Note:  .   Date:  11/07/2023  ID:  Terrence Bruce, DOB [birth year], MRN [id number] PCP: Kip Righter, MD  [facility] Providers Cardiologist:  Soyla DELENA Merck, MD {  History of Present Illness: Terrence   DEGAN Bruce is a 53 y.o. male with history of familial history of CAD (mother had CABG in 73s), hyperlipidemia.      Family history of CAD Mother had CABG in her 57s Reported chest pain after high emotional stress event.  Coronary CTA 08/2019 CAC score 0, no evidence of CAD.  Social history  [employer] tro[employer], does not smoke, do drugs or alcohol. Wife is a PA for orthopedics.  Looking to retire next year.      Patient with history of chest pain but with normal coronary CT in April 2021.  Today patient presents for annual follow-up, absolutely has no complaints.  He remains active walking 10 to 12 miles each week.  He has been anxious lately because his friend had cardiac arrest and subsequently CABG so he is very cognizant about his cardiovascular risk/health.  ROS: Denies: Chest pain, shortness of breath, orthopnea, peripheral edema, palpitations, decreased exercise intolerance, fatigue, lightheadedness.   Studies Reviewed: Terrence    EKG Interpretation Date/Time:  Tuesday November 07 2023 08:13:50 EDT Ventricular Rate:  53 PR Interval:  156 QRS Duration:  92 QT Interval:  436 QTC Calculation: 409 R Axis:   40  Text Interpretation: Sinus bradycardia Nonspecific ST abnormality No previous ECGs available Q waves notes inferior lateral. Confirmed by [name] [phone]) on 11/07/2023 8:26:45 AM    Risk Assessment/Calculations:           Physical Exam:   VS:  BP (!) 146/80 (BP Location: Right Arm, Patient Position: Sitting)   Pulse (!) 57   Resp 11   Ht 5' 10 (1.778 m)   Wt 191 lb 3.2 oz (86.7 kg)   BMI 27.43 kg/m    Wt Readings from Last 3 Encounters:  11/07/23 191 lb 3.2 oz (86.7 kg)  09/28/22 193 lb 3.2 oz (87.6 kg)  07/27/21 183 lb (83 kg)     GEN: Well nourished, well developed in no acute distress NECK: No JVD; No carotid bruits CARDIAC: RRR, no murmurs, rubs, gallops RESPIRATORY:  Clear to auscultation without rales, wheezing or rhonchi  ABDOMEN: Soft, non-tender, non-distended EXTREMITIES:  No edema; No deformity   ASSESSMENT AND PLAN: .     Family history of CAD No evidence of CAD on coronary CTA 08/2019 he had an episode stress after an emotional event several years ago.  No further issues of chest pain.  And otherwise no other significant risk factors.  Discussed in the future could consider getting CAC score to further stratify, he may be interested in this in the future.  Hyperlipidemia LDL is 90 June 2025. Continue with atorvastatin  40 mg  Elevated BP Generally does not check blood pressure at home, blood pressure in the office in the 140s.  He will go home and check blood pressure 1-2 times per day from next week and let us  know if this is still elevated.  For now not placing him on any therapy.  Dispo: 1 year follow-up, this is his preference otherwise no significant cardiac history.  Signed, Currier LITTIE Darryle, PA-C

## 2023-11-07 ENCOUNTER — Ambulatory Visit: Payer: Self-pay | Attending: Student | Admitting: Cardiology

## 2023-11-07 VITALS — BP 146/80 | HR 57 | Resp 11 | Ht 70.0 in | Wt 191.2 lb

## 2023-11-07 DIAGNOSIS — R030 Elevated blood-pressure reading, without diagnosis of hypertension: Secondary | ICD-10-CM

## 2023-11-07 DIAGNOSIS — Z8249 Family history of ischemic heart disease and other diseases of the circulatory system: Secondary | ICD-10-CM

## 2023-11-07 DIAGNOSIS — E782 Mixed hyperlipidemia: Secondary | ICD-10-CM

## 2023-11-07 DIAGNOSIS — E785 Hyperlipidemia, unspecified: Secondary | ICD-10-CM

## 2023-11-07 NOTE — Patient Instructions (Signed)
 Medication Instructions:   *If you need a refill on your cardiac medications before your next appointment, please call your pharmacy*  Lab Work:  If you have labs (blood work) drawn today and your tests are completely normal, you will receive your results only by: MyChart Message (if you have MyChart) OR A paper copy in the mail If you have any lab test that is abnormal or we need to change your treatment, we will call you to review the results.  Testing/Procedures:   Follow-Up: At [facility], you and your health needs are our priority.  As part of our continuing mission to provide you with exceptional heart care, our providers are all part of one team.  This team includes your primary Cardiologist (physician) and Advanced Practice Providers or APPs (Physician Assistants and Nurse Practitioners) who all work together to provide you with the care you need, when you need it.  Your next appointment:   1 year(s)  Provider:   Soyla DELENA Merck, MD    We recommend signing up for the patient portal called MyChart.  Sign up information is provided on this After Visit Summary.  MyChart is used to connect with patients for Virtual Visits (Telemedicine).  Patients are able to view lab/test results, encounter notes, upcoming appointments, etc.  Non-urgent messages can be sent to your provider as well.   To learn more about what you can do with MyChart, go to [email or web address].   Other Instructions  Check BP at home 1-2 times/ day and keep a log for about 2 weeks and let us  know if consistently elevated. Greater than 140/90.

## 2023-12-04 ENCOUNTER — Other Ambulatory Visit: Payer: Self-pay

## 2023-12-04 MED ORDER — ATORVASTATIN CALCIUM 40 MG PO TABS: 40.0000 mg | ORAL_TABLET | Freq: Every day | ORAL | 3 refills | Status: AC
